# Patient Record
Sex: Male | Born: 1965 | Race: Black or African American | Hispanic: No | Marital: Married | Smoking: Former smoker
Health system: Southern US, Community
[De-identification: ages and names within clinical notes are randomized; demographics above are authoritative.]

## PROBLEM LIST (undated history)

## (undated) DIAGNOSIS — E7800 Pure hypercholesterolemia, unspecified: Secondary | ICD-10-CM

## (undated) DIAGNOSIS — I10 Essential (primary) hypertension: Secondary | ICD-10-CM

## (undated) HISTORY — PX: OTHER SURGICAL HISTORY: SHX169

---

## 2001-07-13 ENCOUNTER — Emergency Department (HOSPITAL_COMMUNITY): Admission: EM | Admit: 2001-07-13 | Discharge: 2001-07-13 | Payer: Self-pay | Admitting: *Deleted

## 2001-11-15 ENCOUNTER — Emergency Department (HOSPITAL_COMMUNITY): Admission: EM | Admit: 2001-11-15 | Discharge: 2001-11-15 | Payer: Self-pay | Admitting: *Deleted

## 2002-05-17 ENCOUNTER — Emergency Department (HOSPITAL_COMMUNITY): Admission: EM | Admit: 2002-05-17 | Discharge: 2002-05-17 | Payer: Self-pay

## 2002-07-05 ENCOUNTER — Emergency Department (HOSPITAL_COMMUNITY): Admission: EM | Admit: 2002-07-05 | Discharge: 2002-07-05 | Payer: Self-pay | Admitting: Emergency Medicine

## 2002-11-21 ENCOUNTER — Emergency Department (HOSPITAL_COMMUNITY): Admission: EM | Admit: 2002-11-21 | Discharge: 2002-11-21 | Payer: Self-pay | Admitting: Emergency Medicine

## 2003-01-16 ENCOUNTER — Emergency Department (HOSPITAL_COMMUNITY): Admission: EM | Admit: 2003-01-16 | Discharge: 2003-01-17 | Payer: Self-pay | Admitting: *Deleted

## 2003-04-03 ENCOUNTER — Emergency Department (HOSPITAL_COMMUNITY): Admission: EM | Admit: 2003-04-03 | Discharge: 2003-04-03 | Payer: Self-pay | Admitting: *Deleted

## 2003-05-29 ENCOUNTER — Emergency Department (HOSPITAL_COMMUNITY): Admission: EM | Admit: 2003-05-29 | Discharge: 2003-05-29 | Payer: Self-pay | Admitting: Emergency Medicine

## 2003-09-04 ENCOUNTER — Emergency Department (HOSPITAL_COMMUNITY): Admission: EM | Admit: 2003-09-04 | Discharge: 2003-09-04 | Payer: Self-pay | Admitting: Emergency Medicine

## 2004-01-01 ENCOUNTER — Emergency Department (HOSPITAL_COMMUNITY): Admission: EM | Admit: 2004-01-01 | Discharge: 2004-01-01 | Payer: Self-pay | Admitting: Emergency Medicine

## 2004-02-07 ENCOUNTER — Emergency Department (HOSPITAL_COMMUNITY): Admission: EM | Admit: 2004-02-07 | Discharge: 2004-02-07 | Payer: Self-pay | Admitting: Emergency Medicine

## 2004-03-30 ENCOUNTER — Emergency Department (HOSPITAL_COMMUNITY): Admission: EM | Admit: 2004-03-30 | Discharge: 2004-03-30 | Payer: Self-pay | Admitting: Emergency Medicine

## 2004-04-02 ENCOUNTER — Emergency Department (HOSPITAL_COMMUNITY): Admission: EM | Admit: 2004-04-02 | Discharge: 2004-04-03 | Payer: Self-pay | Admitting: Emergency Medicine

## 2004-05-22 ENCOUNTER — Emergency Department (HOSPITAL_COMMUNITY): Admission: EM | Admit: 2004-05-22 | Discharge: 2004-05-22 | Payer: Self-pay | Admitting: Emergency Medicine

## 2004-07-09 ENCOUNTER — Emergency Department (HOSPITAL_COMMUNITY): Admission: EM | Admit: 2004-07-09 | Discharge: 2004-07-09 | Payer: Self-pay | Admitting: Emergency Medicine

## 2004-10-31 ENCOUNTER — Emergency Department (HOSPITAL_COMMUNITY): Admission: EM | Admit: 2004-10-31 | Discharge: 2004-10-31 | Payer: Self-pay | Admitting: Emergency Medicine

## 2004-12-17 ENCOUNTER — Emergency Department (HOSPITAL_COMMUNITY): Admission: EM | Admit: 2004-12-17 | Discharge: 2004-12-17 | Payer: Self-pay | Admitting: Emergency Medicine

## 2005-04-14 ENCOUNTER — Emergency Department (HOSPITAL_COMMUNITY): Admission: EM | Admit: 2005-04-14 | Discharge: 2005-04-14 | Payer: Self-pay | Admitting: Emergency Medicine

## 2005-06-17 ENCOUNTER — Emergency Department (HOSPITAL_COMMUNITY): Admission: EM | Admit: 2005-06-17 | Discharge: 2005-06-17 | Payer: Self-pay | Admitting: Emergency Medicine

## 2005-07-29 ENCOUNTER — Emergency Department (HOSPITAL_COMMUNITY): Admission: EM | Admit: 2005-07-29 | Discharge: 2005-07-29 | Payer: Self-pay | Admitting: Emergency Medicine

## 2005-08-25 ENCOUNTER — Emergency Department (HOSPITAL_COMMUNITY): Admission: EM | Admit: 2005-08-25 | Discharge: 2005-08-25 | Payer: Self-pay | Admitting: Emergency Medicine

## 2005-10-14 ENCOUNTER — Emergency Department (HOSPITAL_COMMUNITY): Admission: EM | Admit: 2005-10-14 | Discharge: 2005-10-14 | Payer: Self-pay | Admitting: Emergency Medicine

## 2005-12-03 ENCOUNTER — Emergency Department (HOSPITAL_COMMUNITY): Admission: EM | Admit: 2005-12-03 | Discharge: 2005-12-03 | Payer: Self-pay | Admitting: Emergency Medicine

## 2006-01-05 ENCOUNTER — Emergency Department (HOSPITAL_COMMUNITY): Admission: EM | Admit: 2006-01-05 | Discharge: 2006-01-05 | Payer: Self-pay | Admitting: Emergency Medicine

## 2006-06-25 ENCOUNTER — Emergency Department (HOSPITAL_COMMUNITY): Admission: EM | Admit: 2006-06-25 | Discharge: 2006-06-25 | Payer: Self-pay | Admitting: Emergency Medicine

## 2006-07-29 ENCOUNTER — Emergency Department (HOSPITAL_COMMUNITY): Admission: EM | Admit: 2006-07-29 | Discharge: 2006-07-29 | Payer: Self-pay | Admitting: Emergency Medicine

## 2006-10-05 ENCOUNTER — Emergency Department (HOSPITAL_COMMUNITY): Admission: EM | Admit: 2006-10-05 | Discharge: 2006-10-05 | Payer: Self-pay | Admitting: Emergency Medicine

## 2007-03-02 ENCOUNTER — Emergency Department (HOSPITAL_COMMUNITY): Admission: EM | Admit: 2007-03-02 | Discharge: 2007-03-02 | Payer: Self-pay | Admitting: Emergency Medicine

## 2007-04-30 ENCOUNTER — Emergency Department (HOSPITAL_COMMUNITY): Admission: EM | Admit: 2007-04-30 | Discharge: 2007-04-30 | Payer: Self-pay | Admitting: Emergency Medicine

## 2007-05-28 ENCOUNTER — Emergency Department (HOSPITAL_COMMUNITY): Admission: EM | Admit: 2007-05-28 | Discharge: 2007-05-28 | Payer: Self-pay | Admitting: Emergency Medicine

## 2007-06-28 ENCOUNTER — Emergency Department (HOSPITAL_COMMUNITY): Admission: EM | Admit: 2007-06-28 | Discharge: 2007-06-28 | Payer: Self-pay | Admitting: Emergency Medicine

## 2007-07-21 ENCOUNTER — Emergency Department (HOSPITAL_COMMUNITY): Admission: EM | Admit: 2007-07-21 | Discharge: 2007-07-21 | Payer: Self-pay | Admitting: Emergency Medicine

## 2007-08-10 ENCOUNTER — Emergency Department (HOSPITAL_COMMUNITY): Admission: EM | Admit: 2007-08-10 | Discharge: 2007-08-10 | Payer: Self-pay | Admitting: Emergency Medicine

## 2007-09-03 ENCOUNTER — Emergency Department (HOSPITAL_COMMUNITY): Admission: EM | Admit: 2007-09-03 | Discharge: 2007-09-03 | Payer: Self-pay | Admitting: Emergency Medicine

## 2007-10-11 ENCOUNTER — Emergency Department (HOSPITAL_COMMUNITY): Admission: EM | Admit: 2007-10-11 | Discharge: 2007-10-11 | Payer: Self-pay | Admitting: Emergency Medicine

## 2007-12-20 ENCOUNTER — Emergency Department (HOSPITAL_COMMUNITY): Admission: EM | Admit: 2007-12-20 | Discharge: 2007-12-20 | Payer: Self-pay | Admitting: Emergency Medicine

## 2008-04-17 ENCOUNTER — Emergency Department (HOSPITAL_COMMUNITY): Admission: EM | Admit: 2008-04-17 | Discharge: 2008-04-17 | Payer: Self-pay | Admitting: Emergency Medicine

## 2008-05-22 ENCOUNTER — Emergency Department (HOSPITAL_COMMUNITY): Admission: EM | Admit: 2008-05-22 | Discharge: 2008-05-22 | Payer: Self-pay | Admitting: Emergency Medicine

## 2008-06-17 ENCOUNTER — Emergency Department (HOSPITAL_COMMUNITY): Admission: EM | Admit: 2008-06-17 | Discharge: 2008-06-17 | Payer: Self-pay | Admitting: Emergency Medicine

## 2008-06-22 ENCOUNTER — Emergency Department (HOSPITAL_COMMUNITY): Admission: EM | Admit: 2008-06-22 | Discharge: 2008-06-22 | Payer: Self-pay | Admitting: Emergency Medicine

## 2008-07-25 ENCOUNTER — Emergency Department (HOSPITAL_COMMUNITY): Admission: EM | Admit: 2008-07-25 | Discharge: 2008-07-25 | Payer: Self-pay | Admitting: Emergency Medicine

## 2008-09-04 ENCOUNTER — Emergency Department (HOSPITAL_COMMUNITY): Admission: EM | Admit: 2008-09-04 | Discharge: 2008-09-04 | Payer: Self-pay | Admitting: Emergency Medicine

## 2008-10-31 ENCOUNTER — Emergency Department (HOSPITAL_COMMUNITY): Admission: EM | Admit: 2008-10-31 | Discharge: 2008-10-31 | Payer: Self-pay | Admitting: Emergency Medicine

## 2008-12-06 ENCOUNTER — Emergency Department (HOSPITAL_COMMUNITY): Admission: EM | Admit: 2008-12-06 | Discharge: 2008-12-06 | Payer: Self-pay | Admitting: Emergency Medicine

## 2009-01-15 ENCOUNTER — Emergency Department (HOSPITAL_COMMUNITY): Admission: EM | Admit: 2009-01-15 | Discharge: 2009-01-15 | Payer: Self-pay | Admitting: Emergency Medicine

## 2009-02-05 ENCOUNTER — Emergency Department (HOSPITAL_COMMUNITY): Admission: EM | Admit: 2009-02-05 | Discharge: 2009-02-05 | Payer: Self-pay | Admitting: Emergency Medicine

## 2009-04-02 ENCOUNTER — Emergency Department (HOSPITAL_COMMUNITY): Admission: EM | Admit: 2009-04-02 | Discharge: 2009-04-02 | Payer: Self-pay | Admitting: Emergency Medicine

## 2009-06-19 ENCOUNTER — Emergency Department (HOSPITAL_COMMUNITY): Admission: EM | Admit: 2009-06-19 | Discharge: 2009-06-19 | Payer: Self-pay | Admitting: Emergency Medicine

## 2009-10-30 ENCOUNTER — Emergency Department (HOSPITAL_COMMUNITY): Admission: EM | Admit: 2009-10-30 | Discharge: 2009-10-30 | Payer: Self-pay | Admitting: Emergency Medicine

## 2010-01-13 ENCOUNTER — Emergency Department (HOSPITAL_COMMUNITY): Admission: EM | Admit: 2010-01-13 | Discharge: 2010-01-13 | Payer: Self-pay | Admitting: Emergency Medicine

## 2010-06-17 ENCOUNTER — Emergency Department (HOSPITAL_COMMUNITY): Admission: EM | Admit: 2010-06-17 | Discharge: 2010-06-17 | Payer: Self-pay | Admitting: Emergency Medicine

## 2010-06-25 ENCOUNTER — Emergency Department (HOSPITAL_COMMUNITY): Admission: EM | Admit: 2010-06-25 | Discharge: 2010-06-25 | Payer: Self-pay | Admitting: Emergency Medicine

## 2010-07-23 ENCOUNTER — Emergency Department (HOSPITAL_COMMUNITY): Admission: EM | Admit: 2010-07-23 | Discharge: 2010-07-23 | Payer: Self-pay | Admitting: Emergency Medicine

## 2010-09-04 ENCOUNTER — Emergency Department (HOSPITAL_COMMUNITY): Admission: EM | Admit: 2010-09-04 | Discharge: 2010-09-04 | Payer: Self-pay | Admitting: Emergency Medicine

## 2010-09-19 ENCOUNTER — Emergency Department (HOSPITAL_COMMUNITY): Admission: EM | Admit: 2010-09-19 | Discharge: 2010-09-19 | Payer: Self-pay | Admitting: Emergency Medicine

## 2010-11-13 ENCOUNTER — Emergency Department (HOSPITAL_COMMUNITY)
Admission: EM | Admit: 2010-11-13 | Discharge: 2010-11-13 | Payer: Self-pay | Source: Home / Self Care | Admitting: Emergency Medicine

## 2011-02-10 LAB — URINALYSIS, ROUTINE W REFLEX MICROSCOPIC
Bilirubin Urine: NEGATIVE
Glucose, UA: NEGATIVE mg/dL
Hgb urine dipstick: NEGATIVE
Ketones, ur: NEGATIVE mg/dL
Nitrite: NEGATIVE
Protein, ur: NEGATIVE mg/dL
Specific Gravity, Urine: 1.025 (ref 1.005–1.030)
Urobilinogen, UA: 0.2 mg/dL (ref 0.0–1.0)
pH: 5.5 (ref 5.0–8.0)

## 2011-02-13 LAB — DIFFERENTIAL
Basophils Absolute: 0 10*3/uL (ref 0.0–0.1)
Basophils Relative: 0 % (ref 0–1)
Eosinophils Absolute: 0.2 10*3/uL (ref 0.0–0.7)
Eosinophils Relative: 3 % (ref 0–5)
Lymphocytes Relative: 27 % (ref 12–46)
Lymphs Abs: 1.9 10*3/uL (ref 0.7–4.0)
Monocytes Absolute: 0.5 10*3/uL (ref 0.1–1.0)
Monocytes Relative: 7 % (ref 3–12)
Neutro Abs: 4.4 10*3/uL (ref 1.7–7.7)
Neutrophils Relative %: 63 % (ref 43–77)

## 2011-02-13 LAB — CBC
HCT: 44.6 % (ref 39.0–52.0)
Hemoglobin: 15.2 g/dL (ref 13.0–17.0)
MCH: 28.7 pg (ref 26.0–34.0)
MCHC: 34 g/dL (ref 30.0–36.0)
MCV: 84.3 fL (ref 78.0–100.0)
Platelets: 202 10*3/uL (ref 150–400)
RBC: 5.29 MIL/uL (ref 4.22–5.81)
RDW: 14.2 % (ref 11.5–15.5)
WBC: 7 10*3/uL (ref 4.0–10.5)

## 2011-02-13 LAB — URINALYSIS, ROUTINE W REFLEX MICROSCOPIC
Bilirubin Urine: NEGATIVE
Glucose, UA: NEGATIVE mg/dL
Hgb urine dipstick: NEGATIVE
Ketones, ur: NEGATIVE mg/dL
Nitrite: NEGATIVE
Protein, ur: NEGATIVE mg/dL
Specific Gravity, Urine: 1.01 (ref 1.005–1.030)
Urobilinogen, UA: 0.2 mg/dL (ref 0.0–1.0)
pH: 6.5 (ref 5.0–8.0)

## 2011-02-13 LAB — COMPREHENSIVE METABOLIC PANEL
ALT: 27 U/L (ref 0–53)
AST: 18 U/L (ref 0–37)
Albumin: 4 g/dL (ref 3.5–5.2)
Alkaline Phosphatase: 64 U/L (ref 39–117)
BUN: 13 mg/dL (ref 6–23)
CO2: 26 mEq/L (ref 19–32)
Calcium: 9.1 mg/dL (ref 8.4–10.5)
Chloride: 106 mEq/L (ref 96–112)
Creatinine, Ser: 1 mg/dL (ref 0.4–1.5)
GFR calc Af Amer: >60 mL/min (ref >60)
GFR calc non Af Amer: >60 mL/min (ref >60)
Glucose, Bld: 87 mg/dL (ref 70–99)
Potassium: 4.4 mEq/L (ref 3.5–5.1)
Sodium: 137 mEq/L (ref 135–145)
Total Bilirubin: 1 mg/dL (ref 0.3–1.2)
Total Protein: 6.9 g/dL (ref 6.0–8.3)

## 2011-03-09 LAB — COMPREHENSIVE METABOLIC PANEL
ALT: 25 U/L (ref 0–53)
AST: 26 U/L (ref 0–37)
Albumin: 4 g/dL (ref 3.5–5.2)
Alkaline Phosphatase: 78 U/L (ref 39–117)
BUN: 12 mg/dL (ref 6–23)
CO2: 27 mEq/L (ref 19–32)
Calcium: 9.4 mg/dL (ref 8.4–10.5)
Chloride: 105 mEq/L (ref 96–112)
Creatinine, Ser: 1.12 mg/dL (ref 0.4–1.5)
GFR calc Af Amer: >60 mL/min (ref >60)
GFR calc non Af Amer: >60 mL/min (ref >60)
Glucose, Bld: 86 mg/dL (ref 70–99)
Potassium: 3.8 mEq/L (ref 3.5–5.1)
Sodium: 138 mEq/L (ref 135–145)
Total Bilirubin: 1.1 mg/dL (ref 0.3–1.2)
Total Protein: 6.9 g/dL (ref 6.0–8.3)

## 2011-03-09 LAB — DIFFERENTIAL
Basophils Absolute: 0.1 10*3/uL (ref 0.0–0.1)
Basophils Relative: 1 % (ref 0–1)
Eosinophils Absolute: 0.2 10*3/uL (ref 0.0–0.7)
Eosinophils Relative: 2 % (ref 0–5)
Lymphocytes Relative: 26 % (ref 12–46)
Lymphs Abs: 1.9 10*3/uL (ref 0.7–4.0)
Monocytes Absolute: 0.6 10*3/uL (ref 0.1–1.0)
Monocytes Relative: 8 % (ref 3–12)
Neutro Abs: 4.7 10*3/uL (ref 1.7–7.7)
Neutrophils Relative %: 63 % (ref 43–77)

## 2011-03-09 LAB — CBC
HCT: 44.9 % (ref 39.0–52.0)
Hemoglobin: 15.9 g/dL (ref 13.0–17.0)
MCHC: 35.3 g/dL (ref 30.0–36.0)
MCV: 83.7 fL (ref 78.0–100.0)
Platelets: 202 10*3/uL (ref 150–400)
RBC: 5.36 MIL/uL (ref 4.22–5.81)
RDW: 13.8 % (ref 11.5–15.5)
WBC: 7.4 10*3/uL (ref 4.0–10.5)

## 2011-03-09 LAB — URINALYSIS, ROUTINE W REFLEX MICROSCOPIC
Bilirubin Urine: NEGATIVE
Glucose, UA: NEGATIVE mg/dL
Hgb urine dipstick: NEGATIVE
Ketones, ur: NEGATIVE mg/dL
Nitrite: NEGATIVE
Protein, ur: NEGATIVE mg/dL
Specific Gravity, Urine: 1.015 (ref 1.005–1.030)
Urobilinogen, UA: 0.2 mg/dL (ref 0.0–1.0)
pH: 5.5 (ref 5.0–8.0)

## 2011-03-09 LAB — LIPASE, BLOOD: Lipase: 19 U/L (ref 11–59)

## 2011-03-18 LAB — POCT I-STAT, CHEM 8
BUN: 14 mg/dL (ref 6–23)
Calcium, Ion: 1.12 mmol/L (ref 1.12–1.32)
Chloride: 108 mEq/L (ref 96–112)
Creatinine, Ser: 1 mg/dL (ref 0.4–1.5)
Glucose, Bld: 105 mg/dL — ABNORMAL HIGH (ref 70–99)
HCT: 48 % (ref 39.0–52.0)
Hemoglobin: 16.3 g/dL (ref 13.0–17.0)
Potassium: 4 mEq/L (ref 3.5–5.1)
Sodium: 140 mEq/L (ref 135–145)
TCO2: 21 mmol/L (ref 0–100)

## 2011-03-18 LAB — POCT CARDIAC MARKERS
CKMB, poc: 1.1 ng/mL (ref 1.0–8.0)
Myoglobin, poc: 57 ng/mL (ref 12–200)
Troponin i, poc: <0.05 ng/mL (ref 0.00–0.09)

## 2011-04-21 ENCOUNTER — Emergency Department (HOSPITAL_COMMUNITY)
Admission: EM | Admit: 2011-04-21 | Discharge: 2011-04-21 | Disposition: A | Discharge status: Home / Self Care | Payer: 59 | Attending: Emergency Medicine | Admitting: Emergency Medicine

## 2011-04-21 ENCOUNTER — Emergency Department (HOSPITAL_COMMUNITY): Payer: 59

## 2011-04-21 DIAGNOSIS — M79609 Pain in unspecified limb: Secondary | ICD-10-CM | POA: Insufficient documentation

## 2011-04-21 DIAGNOSIS — E785 Hyperlipidemia, unspecified: Secondary | ICD-10-CM | POA: Insufficient documentation

## 2011-04-21 DIAGNOSIS — I10 Essential (primary) hypertension: Secondary | ICD-10-CM | POA: Insufficient documentation

## 2011-04-21 DIAGNOSIS — M109 Gout, unspecified: Secondary | ICD-10-CM | POA: Insufficient documentation

## 2011-05-19 ENCOUNTER — Emergency Department (HOSPITAL_COMMUNITY)
Admission: EM | Admit: 2011-05-19 | Discharge: 2011-05-19 | Disposition: A | Discharge status: Home / Self Care | Payer: 59 | Attending: Emergency Medicine | Admitting: Emergency Medicine

## 2011-05-19 DIAGNOSIS — Y92009 Unspecified place in unspecified non-institutional (private) residence as the place of occurrence of the external cause: Secondary | ICD-10-CM | POA: Insufficient documentation

## 2011-05-19 DIAGNOSIS — I10 Essential (primary) hypertension: Secondary | ICD-10-CM | POA: Insufficient documentation

## 2011-05-19 DIAGNOSIS — M549 Dorsalgia, unspecified: Secondary | ICD-10-CM | POA: Insufficient documentation

## 2011-05-19 DIAGNOSIS — X58XXXA Exposure to other specified factors, initial encounter: Secondary | ICD-10-CM | POA: Insufficient documentation

## 2011-05-19 DIAGNOSIS — S61409A Unspecified open wound of unspecified hand, initial encounter: Secondary | ICD-10-CM | POA: Insufficient documentation

## 2011-07-11 ENCOUNTER — Encounter: Payer: Self-pay | Admitting: *Deleted

## 2011-07-11 ENCOUNTER — Emergency Department (HOSPITAL_COMMUNITY)
Admission: EM | Admit: 2011-07-11 | Discharge: 2011-07-11 | Disposition: A | Discharge status: Home / Self Care | Payer: 59 | Attending: Emergency Medicine | Admitting: Emergency Medicine

## 2011-07-11 ENCOUNTER — Other Ambulatory Visit: Payer: Self-pay

## 2011-07-11 ENCOUNTER — Emergency Department (HOSPITAL_COMMUNITY): Payer: 59

## 2011-07-11 DIAGNOSIS — J40 Bronchitis, not specified as acute or chronic: Secondary | ICD-10-CM | POA: Insufficient documentation

## 2011-07-11 DIAGNOSIS — F17200 Nicotine dependence, unspecified, uncomplicated: Secondary | ICD-10-CM | POA: Insufficient documentation

## 2011-07-11 DIAGNOSIS — I10 Essential (primary) hypertension: Secondary | ICD-10-CM | POA: Insufficient documentation

## 2011-07-11 HISTORY — DX: Essential (primary) hypertension: I10

## 2011-07-11 MED ORDER — ALBUTEROL SULFATE HFA 108 (90 BASE) MCG/ACT IN AERS: 1.0000 | INHALATION_SPRAY | Freq: Four times a day (QID) | RESPIRATORY_TRACT | Status: DC | PRN

## 2011-07-11 MED ORDER — BENZONATATE 100 MG PO CAPS: 200.0000 mg | ORAL_CAPSULE | Freq: Two times a day (BID) | ORAL | Status: AC | PRN

## 2011-07-11 MED ORDER — ALBUTEROL SULFATE HFA 108 (90 BASE) MCG/ACT IN AERS
2.0000 | INHALATION_SPRAY | Freq: Once | RESPIRATORY_TRACT | Status: AC
Start: 2011-07-11 — End: 2011-07-11
  Administered 2011-07-11: 2 via RESPIRATORY_TRACT
  Filled 2011-07-11: qty 6.7

## 2011-07-11 NOTE — ED Notes (Signed)
C/o congestion, productive cough, and difficulty breathing that started last night.

## 2011-07-11 NOTE — ED Notes (Signed)
C/o cough and feeling sob. C/o cp with deep breaths. nad noted at this time. Pt breathing even/nonlabored and all lung fields cta. Sats maintained on RA.

## 2011-07-11 NOTE — ED Provider Notes (Signed)
History     CSN: [id number] Arrival date & time: 07/11/2011  4:30 PM  Chief Complaint  Patient presents with  . Cough  . Nasal Congestion   HPI Comments: Patient is a 45 year old male who presents with a complaint of cough and shortness of breath. He states that this started last night and has been persistent, nothing makes it better or worse, associated with no fevers or chills. The cough is dry and nonproductive. He denies swelling in his legs, positional discomfort or dyspnea, exertional symptoms, skin rashes. He notes a dose have some squeezing in his chest when he takes a deep breath. He does have a history of hypertension but no history of diabetes.  Patient is a 45 y.o. male presenting with cough. The history is provided by the patient.  Cough This is a new problem. The current episode started 12 to 24 hours ago. The problem occurs every few minutes. The problem has been rapidly worsening. The cough is non-productive. There has been no fever. Associated symptoms include sore throat and shortness of breath. Pertinent negatives include no chest pain, no chills, no sweats, no weight loss, no ear congestion, no ear pain, no headaches, no rhinorrhea, no myalgias, no wheezing and no eye redness. He has tried nothing for the symptoms. He is a smoker (Occasional cigar). His past medical history does not include bronchitis, pneumonia, COPD, emphysema or asthma.    Past Medical History  Diagnosis Date  . Hypertension   . Gout     History reviewed. No pertinent past surgical history.  No family history on file.  History  Substance Use Topics  . Smoking status: Current Some Day Smoker    Types: Cigars  . Smokeless tobacco: Not on file  . Alcohol Use: No      Review of Systems  Constitutional: Negative for fever, chills and weight loss.  HENT: Positive for sore throat. Negative for ear pain, rhinorrhea and neck pain.   Eyes: Negative for redness and visual disturbance.  Respiratory:  Positive for cough and shortness of breath. Negative for wheezing.   Cardiovascular: Negative for chest pain.  Gastrointestinal: Negative for nausea, vomiting, abdominal pain and diarrhea.  Genitourinary: Negative for dysuria and frequency.  Musculoskeletal: Negative for myalgias and back pain.  Skin: Negative for rash.  Neurological: Negative for weakness, numbness and headaches.  Hematological: Negative for adenopathy.  Psychiatric/Behavioral: Negative for behavioral problems.    Physical Exam  BP 155/104  Pulse 75  Temp(Src) 98.4 F (36.9 C) (Oral)  Resp 20  Ht 6\' 2"  (1.88 m)  Wt 280 lb (127.007 kg)  BMI 35.95 kg/m2  SpO2 100%  Physical Exam  Nursing note and vitals reviewed. Constitutional: He appears well-developed and well-nourished. No distress.  HENT:  Head: Normocephalic and atraumatic.  Mouth/Throat: Oropharynx is clear and moist. No oropharyngeal exudate.  Eyes: Conjunctivae and EOM are normal. Pupils are equal, round, and reactive to light. Right eye exhibits no discharge. Left eye exhibits no discharge. No scleral icterus.  Neck: Normal range of motion. Neck supple. No JVD present. No thyromegaly present.  Cardiovascular: Normal rate, regular rhythm, normal heart sounds and intact distal pulses.  Exam reveals no gallop and no friction rub.   No murmur heard. Pulmonary/Chest: Effort normal and breath sounds normal. No respiratory distress. He has no wheezes. He has no rales.  Abdominal: Soft. Bowel sounds are normal. He exhibits no distension and no mass. There is no tenderness.  Musculoskeletal: Normal range of motion. He exhibits  no edema and no tenderness.  Lymphadenopathy:    He has no cervical adenopathy.  Neurological: He is alert. Coordination normal.  Skin: Skin is warm and dry. No rash noted. No erythema.  Psychiatric: He has a normal mood and affect. His behavior is normal.    ED Course  Procedures  MDM Patient has very normal exam including clear  lung fields, no tenderness to palpation over his chest or abdomen and no peripheral edema. He has a clear oropharynx without any pharyngeal edema, exudate, asymmetry. Will get a chest x-ray and EKG to rule out any suspicious sources of cardiac or pulmonary source. Suspect a bronchitis albuterol given.    ED ECG REPORT   Date: 07/11/2011   Rate: 71  Rhythm: normal sinus rhythm  QRS Axis: normal  Intervals: normal  ST/T Wave abnormalities: normal  Conduction Disutrbances:none  Narrative Interpretation:   Old EKG Reviewed: none available  Chest x-ray reviewed and reveals no signs of infiltrate. Albuterol given and Tessalon Perles without albuterol for discharge.  Vida Roller, MD 07/11/11 [phone]

## 2011-07-28 ENCOUNTER — Emergency Department (HOSPITAL_COMMUNITY)
Admission: EM | Admit: 2011-07-28 | Discharge: 2011-07-28 | Disposition: A | Discharge status: Home / Self Care | Payer: 59 | Attending: Emergency Medicine | Admitting: Emergency Medicine

## 2011-07-28 ENCOUNTER — Encounter (HOSPITAL_COMMUNITY): Payer: Self-pay | Admitting: Emergency Medicine

## 2011-07-28 DIAGNOSIS — I10 Essential (primary) hypertension: Secondary | ICD-10-CM | POA: Insufficient documentation

## 2011-07-28 DIAGNOSIS — H1045 Other chronic allergic conjunctivitis: Secondary | ICD-10-CM

## 2011-07-28 DIAGNOSIS — F17200 Nicotine dependence, unspecified, uncomplicated: Secondary | ICD-10-CM | POA: Insufficient documentation

## 2011-07-28 MED ORDER — KETOROLAC TROMETHAMINE 0.5 % OP SOLN
1.0000 [drp] | Freq: Once | OPHTHALMIC | Status: AC
  Administered 2011-07-28: 1 [drp] via OPHTHALMIC
  Filled 2011-07-28: qty 5

## 2011-07-28 NOTE — ED Notes (Signed)
Pt c/o sinus pressure and head pain since yesterday.

## 2011-07-28 NOTE — ED Provider Notes (Signed)
History     CSN: [id number] Arrival date & time: 07/28/2011  5:11 PM  Chief Complaint  Patient presents with  . Sinusitis   Patient is a 45 y.o. male presenting with sinusitis. The history is provided by the patient.  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  He has had nasal congestion,  irritated and itchy eyes since the event.) problem. The current episode started 12 to 24 hours ago. The problem has not changed since onset.There has been no fever. The pain is at a severity of 1/10. The pain is mild. The pain has been constant since onset. Associated symptoms include congestion. Pertinent negatives include no chills, no ear pain, no hoarse voice, no sinus pressure, no sore throat, no cough and no shortness of breath. He has tried nothing for the symptoms.    Past Medical History  Diagnosis Date  . Hypertension   . Gout   . Gout     History reviewed. No pertinent past surgical history.  History reviewed. No pertinent family history.  History  Substance Use Topics  . Smoking status: Current Some Day Smoker    Types: Cigars  . Smokeless tobacco: Not on file  . Alcohol Use: No      Review of Systems  Constitutional: Negative for fever and chills.  HENT: Positive for congestion and rhinorrhea. Negative for ear pain, sore throat, hoarse voice, trouble swallowing, neck pain, voice change, postnasal drip and sinus pressure.   Eyes: Negative.   Respiratory: Negative for cough, choking, chest tightness, shortness of breath, wheezing and stridor.   Cardiovascular: Negative for chest pain and palpitations.  Gastrointestinal: Negative for nausea and abdominal pain.  Genitourinary: Negative.   Musculoskeletal: Negative for joint swelling and arthralgias.  Skin: Negative.  Negative for rash and wound.  Neurological: Negative for dizziness, weakness, light-headedness,  numbness and headaches.  Hematological: Negative.   Psychiatric/Behavioral: Negative.     Physical Exam  BP 145/91  Pulse 75  Temp(Src) 98.6 F (37 C) (Oral)  Resp 16  Ht 6\' 2"  (1.88 m)  Wt 285 lb (129.275 kg)  BMI 36.59 kg/m2  SpO2 98%  Physical Exam  Nursing note and vitals reviewed. Constitutional: He is oriented to person, place, and time. He appears well-developed and well-nourished.  HENT:  Head: Normocephalic and atraumatic.  Eyes: EOM and lids are normal. Pupils are equal, round, and reactive to light. Right conjunctiva is injected. Left conjunctiva is injected.  Neck: Normal range of motion.  Cardiovascular: Normal rate, regular rhythm, normal heart sounds and intact distal pulses.   Pulmonary/Chest: Effort normal and breath sounds normal. He has no wheezes.  Abdominal: Soft. Bowel sounds are normal. There is no tenderness.  Musculoskeletal: Normal range of motion.  Neurological: He is alert and oriented to person, place, and time.  Skin: Skin is warm and dry.  Psychiatric: He has a normal mood and affect.    ED Course  Procedures  MDM Allergic conjunctivitis.      Candis Musa, PA 07/28/11 1956

## 2011-07-29 NOTE — ED Provider Notes (Signed)
Medical screening examination/treatment/procedure(s) were performed by non-physician practitioner and as supervising physician I was immediately available for consultation/collaboration. Devoria Albe, MD, Armando Gang  Ward Givens, MD 07/29/11 0111

## 2011-08-18 ENCOUNTER — Other Ambulatory Visit: Payer: Self-pay

## 2011-08-18 ENCOUNTER — Emergency Department (HOSPITAL_COMMUNITY): Payer: 59

## 2011-08-18 ENCOUNTER — Encounter (HOSPITAL_COMMUNITY): Payer: Self-pay | Admitting: *Deleted

## 2011-08-18 ENCOUNTER — Emergency Department (HOSPITAL_COMMUNITY)
Admission: EM | Admit: 2011-08-18 | Discharge: 2011-08-18 | Disposition: A | Discharge status: Home / Self Care | Payer: 59 | Attending: Emergency Medicine | Admitting: Emergency Medicine

## 2011-08-18 DIAGNOSIS — R1012 Left upper quadrant pain: Secondary | ICD-10-CM | POA: Insufficient documentation

## 2011-08-18 DIAGNOSIS — R112 Nausea with vomiting, unspecified: Secondary | ICD-10-CM | POA: Insufficient documentation

## 2011-08-18 DIAGNOSIS — K5289 Other specified noninfective gastroenteritis and colitis: Secondary | ICD-10-CM

## 2011-08-18 DIAGNOSIS — R197 Diarrhea, unspecified: Secondary | ICD-10-CM | POA: Insufficient documentation

## 2011-08-18 DIAGNOSIS — I10 Essential (primary) hypertension: Secondary | ICD-10-CM | POA: Insufficient documentation

## 2011-08-18 DIAGNOSIS — R1011 Right upper quadrant pain: Secondary | ICD-10-CM | POA: Insufficient documentation

## 2011-08-18 DIAGNOSIS — F17200 Nicotine dependence, unspecified, uncomplicated: Secondary | ICD-10-CM | POA: Insufficient documentation

## 2011-08-18 DIAGNOSIS — R509 Fever, unspecified: Secondary | ICD-10-CM | POA: Insufficient documentation

## 2011-08-18 LAB — COMPREHENSIVE METABOLIC PANEL
AST: 21 U/L (ref 0–37)
BUN: 13 mg/dL (ref 6–23)
CO2: 23 mEq/L (ref 19–32)
Calcium: 9.7 mg/dL (ref 8.4–10.5)
Creatinine, Ser: 0.96 mg/dL (ref 0.50–1.35)
GFR calc Af Amer: >60 mL/min (ref >60)
GFR calc non Af Amer: >60 mL/min (ref >60)
Glucose, Bld: 98 mg/dL (ref 70–99)

## 2011-08-18 LAB — CBC
HCT: 47.5 % (ref 39.0–52.0)
MCH: 28.5 pg (ref 26.0–34.0)
MCV: 82 fL (ref 78.0–100.0)
Platelets: 232 10*3/uL (ref 150–400)
RBC: 5.79 MIL/uL (ref 4.22–5.81)

## 2011-08-18 LAB — LIPASE, BLOOD: Lipase: 30 U/L (ref 11–59)

## 2011-08-18 MED ORDER — IOHEXOL 300 MG/ML  SOLN
100.0000 mL | Freq: Once | INTRAMUSCULAR | Status: AC | PRN
  Administered 2011-08-18: 100 mL via INTRAVENOUS

## 2011-08-18 MED ORDER — ONDANSETRON HCL 4 MG/2ML IJ SOLN
4.0000 mg | Freq: Once | INTRAMUSCULAR | Status: AC
  Administered 2011-08-18: 4 mg via INTRAVENOUS
  Filled 2011-08-18: qty 2

## 2011-08-18 MED ORDER — HYDROMORPHONE HCL 1 MG/ML IJ SOLN
1.0000 mg | Freq: Once | INTRAMUSCULAR | Status: AC
  Administered 2011-08-18: 1 mg via INTRAVENOUS
  Filled 2011-08-18: qty 1

## 2011-08-18 MED ORDER — SODIUM CHLORIDE 0.9 % IV BOLUS (SEPSIS)
250.0000 mL | Freq: Once | INTRAVENOUS | Status: AC
  Administered 2011-08-18: 1000 mL via INTRAVENOUS

## 2011-08-18 MED ORDER — HYDROCODONE-ACETAMINOPHEN 5-325 MG PO TABS: 1.0000 | ORAL_TABLET | Freq: Four times a day (QID) | ORAL | Status: AC | PRN

## 2011-08-18 MED ORDER — SODIUM CHLORIDE 0.9 % IV SOLN: INTRAVENOUS | Status: DC

## 2011-08-18 MED ORDER — ONDANSETRON HCL 4 MG PO TABS: 4.0000 mg | ORAL_TABLET | Freq: Three times a day (TID) | ORAL | Status: AC | PRN

## 2011-08-18 NOTE — ED Notes (Signed)
Abdominal pain. Nausea, vomiting and diarrhea

## 2011-08-18 NOTE — ED Provider Notes (Addendum)
History     CSN: [id number] Arrival date & time: 08/18/2011  7:51 PM   Chief Complaint  Patient presents with  . Abdominal Pain     (Include location/radiation/quality/duration/timing/severity/associated sxs/prior treatment) Patient is a 45 y.o. male presenting with abdominal pain.  Abdominal Pain The primary symptoms of the illness include abdominal pain, fever, nausea, vomiting and diarrhea. The primary symptoms of the illness do not include shortness of breath, hematemesis or hematochezia. The current episode started yesterday. The onset of the illness was sudden. The problem has not changed since onset. The abdominal pain is located in the RUQ and LUQ. The abdominal pain does not radiate. The severity of the abdominal pain is 8/10. The abdominal pain is relieved by nothing.  Symptoms associated with the illness do not include heartburn, hematuria or back pain. Significant associated medical issues do not include GERD or diabetes.   ASSOCIATED WITH VOMITNG X 2 EACH DAY  AND DIARRHEA X 4 EACH DAY.   Past Medical History  Diagnosis Date  . Hypertension   . Gout   . Gout      History reviewed. No pertinent past surgical history.  No family history on file.  History  Substance Use Topics  . Smoking status: Current Some Day Smoker    Types: Cigars  . Smokeless tobacco: Not on file  . Alcohol Use: No      Review of Systems  Constitutional: Positive for fever.  HENT: Negative for neck pain.   Eyes: Negative for redness.  Respiratory: Negative for shortness of breath.   Cardiovascular: Negative for chest pain.  Gastrointestinal: Positive for nausea, vomiting, abdominal pain and diarrhea. Negative for heartburn, blood in stool, hematochezia and hematemesis.  Genitourinary: Negative for hematuria.  Musculoskeletal: Negative for back pain.  Skin: Negative for rash.  Neurological: Negative for weakness and headaches.    Allergies  Review of patient's allergies indicates  no known allergies.  Home Medications   Current Outpatient Rx  Name Route Sig Dispense Refill  . ALBUTEROL SULFATE HFA 108 (90 BASE) MCG/ACT IN AERS Inhalation Inhale 1-2 puffs into the lungs every 6 (six) hours as needed for wheezing. 1 Inhaler 0  . LISINOPRIL 10 MG PO TABS Oral Take 10 mg by mouth at bedtime.      . ONE-A-DAY MENS PO Oral Take 1 tablet by mouth daily.      Marland Kitchen SALINE NASAL SPRAY NA Nasal Place 1 spray into the nose 2 (two) times daily.     . TETRAHYDROZOLINE HCL 0.05 % OP SOLN Both Eyes Place 2 drops into both eyes daily as needed. For allergy eye symptoms       Physical Exam    BP 154/99  Pulse 83  Temp 98.8 F (37.1 C)  Resp 20  Ht 6\' 2"  (1.88 m)  Wt 285 lb (129.275 kg)  BMI 36.59 kg/m2  SpO2 97%  Physical Exam  Nursing note and vitals reviewed. Constitutional: He is oriented to person, place, and time. He appears well-developed and well-nourished. No distress.  HENT:  Head: Normocephalic and atraumatic.  Mouth/Throat: Oropharynx is clear and moist.  Eyes: Conjunctivae and EOM are normal. Pupils are equal, round, and reactive to light.  Neck: Normal range of motion. Neck supple.  Cardiovascular: Normal rate and regular rhythm.   No murmur heard. Pulmonary/Chest: Effort normal and breath sounds normal. He exhibits no tenderness.  Abdominal: Soft. Bowel sounds are normal. There is no tenderness.  Musculoskeletal: Normal range of motion. He exhibits no  edema and no tenderness.  Neurological: He is alert and oriented to person, place, and time. He displays normal reflexes. No cranial nerve deficit. He exhibits normal muscle tone. Coordination normal.  Skin: Skin is warm and dry. No rash noted.    ED Course  Procedures  Results for orders placed during the hospital encounter of 08/18/11  CBC      Component Value Range   WBC 8.4  4.0 - 10.5 (K/uL)   RBC 5.79  4.22 - 5.81 (MIL/uL)   Hemoglobin 16.5  13.0 - 17.0 (g/dL)   HCT 40.9  81.1 - 91.4 (%)   MCV  82.0  78.0 - 100.0 (fL)   MCH 28.5  26.0 - 34.0 (pg)   MCHC 34.7  30.0 - 36.0 (g/dL)   RDW 78.2  95.6 - 21.3 (%)   Platelets 232  150 - 400 (K/uL)    Date: 08/18/2011  Rate: 72  Rhythm: normal sinus rhythm  QRS Axis: normal  Intervals: normal  ST/T Wave abnormalities: nonspecific T wave changes  Conduction Disutrbances:none  Narrative Interpretation:   Old EKG Reviewed: unchanged FROM 07/11/11 AND TODAY ? IN COMPLETE RBBB    No diagnosis found.   MDM UQ ABD PAIN NO CP WITH VOMITNG AND DIARRHEA BUT NOT CLASSIC FOR VIRAL GE. WILL GET ABD LABS AND CT ABD IF NEGATIVE OKAY FOR DISCHARGE.        Shelda Jakes, MD 08/18/11 0865  Shelda Jakes, MD 08/18/11 2227

## 2011-08-18 NOTE — ED Provider Notes (Signed)
Benny Lennert, MD to patient bedside. Patient explained lab and imaging results. Informed of intent to d/c home and recommend follow up with PCP should problems persist. Patient agrees with plan set forth at this time.   Written by Clarita Crane acting as scribe for Dr. Estell Harpin. The chart was scribed for me under my direct supervision.  I personally performed the history, physical, and medical decision making and all procedures in the evaluation of this patient.Benny Lennert, MD 08/18/11 2312

## 2011-12-24 ENCOUNTER — Encounter (HOSPITAL_COMMUNITY): Payer: Self-pay | Admitting: *Deleted

## 2011-12-24 ENCOUNTER — Emergency Department (HOSPITAL_COMMUNITY)
Admission: EM | Admit: 2011-12-24 | Discharge: 2011-12-24 | Disposition: A | Discharge status: Home / Self Care | Payer: 59 | Attending: Emergency Medicine | Admitting: Emergency Medicine

## 2011-12-24 DIAGNOSIS — M549 Dorsalgia, unspecified: Secondary | ICD-10-CM | POA: Insufficient documentation

## 2011-12-24 DIAGNOSIS — X503XXA Overexertion from repetitive movements, initial encounter: Secondary | ICD-10-CM | POA: Insufficient documentation

## 2011-12-24 DIAGNOSIS — T148XXA Other injury of unspecified body region, initial encounter: Secondary | ICD-10-CM | POA: Insufficient documentation

## 2011-12-24 DIAGNOSIS — I10 Essential (primary) hypertension: Secondary | ICD-10-CM | POA: Insufficient documentation

## 2011-12-24 MED ORDER — IBUPROFEN 800 MG PO TABS: 800.0000 mg | ORAL_TABLET | Freq: Three times a day (TID) | ORAL | Status: AC

## 2011-12-24 MED ORDER — CYCLOBENZAPRINE HCL 10 MG PO TABS: 10.0000 mg | ORAL_TABLET | Freq: Two times a day (BID) | ORAL | Status: AC | PRN

## 2011-12-24 NOTE — ED Provider Notes (Signed)
Medical screening examination/treatment/procedure(s) were performed by non-physician practitioner and as supervising physician I was immediately available for consultation/collaboration.  Nicoletta Dress. Colon Branch, MD 12/24/11 2244

## 2011-12-24 NOTE — ED Notes (Signed)
LOw back pain,  Had been moving furniture, Has chronic back pain

## 2011-12-24 NOTE — ED Provider Notes (Signed)
History     CSN: [id number]  Arrival date & time 12/24/11  1406   First MD Initiated Contact with Patient 12/24/11 1421      Chief Complaint  Patient presents with  . Back Pain    HPI Cody Harmon is a 46 y.o. male who presents to the ED for low back pain. He reports lifting and moving furniture 2 days ago. Felt pain yesterday morning and has gotten progressively worse. Rates the pain as 10/10. Denies numbness or tingling of legs. the pain is located in the lower right side of the the back. No radiation. Denies loss of control of bladder or bowels. No nausea or vomiting. No other problems. The pain is better when remaining still and getting in certain positions. Worse with any movement. The history was provided by the patient.  Past Medical History  Diagnosis Date  . Hypertension   . Gout   . Gout     History reviewed. No pertinent past surgical history.  Family History  Problem Relation Age of Onset  . Hypertension Mother   . Stroke Father   . Stroke Brother   . Osteoarthritis Brother     History  Substance Use Topics  . Smoking status: Former Smoker    Types: Cigars  . Smokeless tobacco: Not on file  . Alcohol Use: No      Review of Systems  Constitutional: Negative for fever, chills, diaphoresis and fatigue.  HENT: Negative for ear pain, congestion, sore throat, facial swelling, neck pain, neck stiffness, dental problem and sinus pressure.   Eyes: Negative for photophobia, pain and discharge.  Respiratory: Negative for cough, chest tightness and wheezing.   Gastrointestinal: Negative for nausea, vomiting, abdominal pain, diarrhea, constipation and abdominal distention.  Genitourinary: Negative for dysuria, urgency, frequency, flank pain and difficulty urinating.  Musculoskeletal: Positive for back pain. Negative for myalgias and gait problem.  Skin: Negative.   Neurological: Negative for dizziness, syncope, speech difficulty, weakness, light-headedness,  numbness and headaches.  Hematological: Negative.   Psychiatric/Behavioral: Negative for confusion and agitation. The patient is not nervous/anxious.     Allergies  Review of patient's allergies indicates no known allergies.  Home Medications   Current Outpatient Rx  Name Route Sig Dispense Refill  . ALBUTEROL SULFATE HFA 108 (90 BASE) MCG/ACT IN AERS Inhalation Inhale 1-2 puffs into the lungs every 6 (six) hours as needed for wheezing. 1 Inhaler 0  . LISINOPRIL 10 MG PO TABS Oral Take 10 mg by mouth at bedtime.      . ONE-A-DAY MENS PO Oral Take 1 tablet by mouth daily.      Marland Kitchen SALINE NASAL SPRAY NA Nasal Place 1 spray into the nose 2 (two) times daily.     . TETRAHYDROZOLINE HCL 0.05 % OP SOLN Both Eyes Place 2 drops into both eyes daily as needed. For allergy eye symptoms       BP 164/102  Pulse 62  Temp(Src) 98.6 F (37 C) (Oral)  Resp 20  Ht 6\' 2"  (1.88 m)  Wt 295 lb (133.811 kg)  BMI 37.88 kg/m2  SpO2 98%  Physical Exam  Constitutional: He is oriented to person, place, and time. He appears well-developed and well-nourished. No distress.       Uncomfortable appearing.  HENT:  Head: Normocephalic and atraumatic.  Eyes: Conjunctivae and EOM are normal. Pupils are equal, round, and reactive to light.  Neck: Normal range of motion. Neck supple.  Cardiovascular: Normal rate.   Pulmonary/Chest: Effort  normal.  Abdominal: Soft. There is no tenderness.  Musculoskeletal:       Tender with palpation right lumbar area. Muscle spasm noted. Pain with ROM of back.   Neurological: He is alert and oriented to person, place, and time. He has normal reflexes.  Skin: Skin is warm and dry.  Psychiatric: He has a normal mood and affect. His behavior is normal. Judgment and thought content normal.   Assessment: Muscle strain  Plan:  Ibuprofen   Rx flexeril   Follow up with ortho prn   Return as needed.  ED Course  Procedures  MDM          Kerrie Buffalo, NP 12/24/11 [phone]

## 2011-12-24 NOTE — ED Notes (Signed)
Back pain for 2 days, Pt  Has not taken bp med for 2 weeks, "ran out".  Has been taking motrin for pain

## 2012-01-27 ENCOUNTER — Other Ambulatory Visit: Payer: Self-pay

## 2012-01-27 ENCOUNTER — Emergency Department (HOSPITAL_COMMUNITY): Payer: 59

## 2012-01-27 ENCOUNTER — Emergency Department (HOSPITAL_COMMUNITY)
Admission: EM | Admit: 2012-01-27 | Discharge: 2012-01-27 | Disposition: A | Discharge status: Home / Self Care | Payer: 59 | Attending: Emergency Medicine | Admitting: Emergency Medicine

## 2012-01-27 ENCOUNTER — Encounter (HOSPITAL_COMMUNITY): Payer: Self-pay | Admitting: *Deleted

## 2012-01-27 DIAGNOSIS — I10 Essential (primary) hypertension: Secondary | ICD-10-CM | POA: Insufficient documentation

## 2012-01-27 DIAGNOSIS — R42 Dizziness and giddiness: Secondary | ICD-10-CM | POA: Insufficient documentation

## 2012-01-27 DIAGNOSIS — R0789 Other chest pain: Secondary | ICD-10-CM | POA: Insufficient documentation

## 2012-01-27 DIAGNOSIS — H538 Other visual disturbances: Secondary | ICD-10-CM | POA: Insufficient documentation

## 2012-01-27 DIAGNOSIS — R0602 Shortness of breath: Secondary | ICD-10-CM | POA: Insufficient documentation

## 2012-01-27 DIAGNOSIS — J40 Bronchitis, not specified as acute or chronic: Secondary | ICD-10-CM

## 2012-01-27 DIAGNOSIS — Z87891 Personal history of nicotine dependence: Secondary | ICD-10-CM | POA: Insufficient documentation

## 2012-01-27 LAB — DIFFERENTIAL
Basophils Absolute: 0 10*3/uL (ref 0.0–0.1)
Basophils Relative: 1 % (ref 0–1)
Lymphocytes Relative: 28 % (ref 12–46)
Neutro Abs: 4.6 10*3/uL (ref 1.7–7.7)
Neutrophils Relative %: 62 % (ref 43–77)

## 2012-01-27 LAB — POCT I-STAT TROPONIN I: Troponin i, poc: 0.01 ng/mL (ref 0.00–0.08)

## 2012-01-27 LAB — CBC
MCHC: 35.5 g/dL (ref 30.0–36.0)
Platelets: 213 10*3/uL (ref 150–400)
RDW: 13.5 % (ref 11.5–15.5)
WBC: 7.5 10*3/uL (ref 4.0–10.5)

## 2012-01-27 LAB — BASIC METABOLIC PANEL
CO2: 24 mEq/L (ref 19–32)
Chloride: 104 mEq/L (ref 96–112)
GFR calc Af Amer: >90 mL/min (ref >90)
Sodium: 137 mEq/L (ref 135–145)

## 2012-01-27 LAB — D-DIMER, QUANTITATIVE: D-Dimer, Quant: 1.22 ug/mL-FEU — ABNORMAL HIGH (ref 0.00–0.48)

## 2012-01-27 MED ORDER — IOHEXOL 350 MG/ML SOLN
100.0000 mL | Freq: Once | INTRAVENOUS | Status: AC | PRN
  Administered 2012-01-27: 100 mL via INTRAVENOUS

## 2012-01-27 MED ORDER — PREDNISONE 10 MG PO TABS: 20.0000 mg | ORAL_TABLET | Freq: Every day | ORAL | Status: DC

## 2012-01-27 MED ORDER — ALBUTEROL SULFATE HFA 108 (90 BASE) MCG/ACT IN AERS: 2.0000 | INHALATION_SPRAY | RESPIRATORY_TRACT | Status: DC | PRN

## 2012-01-27 MED ORDER — SODIUM CHLORIDE 0.9 % IV SOLN
Freq: Once | INTRAVENOUS | Status: AC
  Administered 2012-01-27: 19:00:00 via INTRAVENOUS

## 2012-01-27 NOTE — Discharge Instructions (Signed)

## 2012-01-27 NOTE — ED Provider Notes (Signed)
History    This chart was scribed for Toy Baker, MD, MD by Smitty Pluck. The patient was seen in room [facility]12 and the patient's care was started at 6:34PM.   CSN: [id number]  Arrival date & time 01/27/12  4098   First MD Initiated Contact with Patient 01/27/12 1821      Chief Complaint  Patient presents with  . Generalized Body Aches  . Dizziness    (Consider location/radiation/quality/duration/timing/severity/associated sxs/prior treatment) The history is provided by the patient.   Cody Harmon is a 46 y.o. male who presents to the Emergency Department complaining of moderate chest pressure and pressure around both eyes radiating towards back of ears bilaterally onset 1 day ago. He reports that he was dizzy with blurred vision and sweaty during onset. The chest pressure started while he was cooking and walking around house. He has never had any symptoms like this before. Pt denies fever, vomiting and diarrhea. Pressure in chest has been constant without radiation. He reports exertion and inhaling deeply aggravates the pain. Notes sinus pressure with some drainage   Past Medical History  Diagnosis Date  . Hypertension   . Gout   . Gout     History reviewed. No pertinent past surgical history.  Family History  Problem Relation Age of Onset  . Hypertension Mother   . Stroke Father   . Stroke Brother   . Osteoarthritis Brother     History  Substance Use Topics  . Smoking status: Former Smoker    Types: Cigars  . Smokeless tobacco: Not on file  . Alcohol Use: No      Review of Systems  All other systems reviewed and are negative.   10 Systems reviewed and are negative for acute change except as noted in the HPI.  Allergies  Review of patient's allergies indicates no known allergies.  Home Medications   Current Outpatient Rx  Name Route Sig Dispense Refill  . ALBUTEROL SULFATE HFA 108 (90 BASE) MCG/ACT IN AERS Inhalation Inhale 1-2 puffs into the lungs  every 6 (six) hours as needed for wheezing. 1 Inhaler 0  . LISINOPRIL 10 MG PO TABS Oral Take 10 mg by mouth at bedtime.      . ONE-A-DAY MENS PO Oral Take 1 tablet by mouth daily.      Marland Kitchen SALINE NASAL SPRAY NA Nasal Place 1 spray into the nose 2 (two) times daily.     . TETRAHYDROZOLINE HCL 0.05 % OP SOLN Both Eyes Place 2 drops into both eyes daily as needed. For allergy eye symptoms       BP 170/103  Pulse 82  Temp(Src) 98.5 F (36.9 C) (Oral)  Resp 20  Ht 6\' 2"  (1.88 m)  Wt 285 lb (129.275 kg)  BMI 36.59 kg/m2  SpO2 98%  Physical Exam  Nursing note and vitals reviewed. Constitutional: He is oriented to person, place, and time. He appears well-developed and well-nourished. No distress.  HENT:  Head: Normocephalic and atraumatic.       Slight tenderness to palpation to maxilla sinuses   Eyes: EOM are normal. Pupils are equal, round, and reactive to light.  Neck: Normal range of motion. Neck supple. No tracheal deviation present.  Cardiovascular: Normal rate, regular rhythm and normal heart sounds.   Pulmonary/Chest: Effort normal. No respiratory distress.  Abdominal: Soft. He exhibits no distension.  Musculoskeletal: Normal range of motion.  Neurological: He is alert and oriented to person, place, and time.  Skin: Skin is  warm and dry.  Psychiatric: He has a normal mood and affect. His behavior is normal.    ED Course  Procedures (including critical care time) DIAGNOSTIC STUDIES: Oxygen Saturation is 98% on room air, normal by my interpretation.    COORDINATION OF CARE: 6:40PM EDP ordered medication: 0.9% NaCl infusion   Labs Reviewed  D-DIMER, QUANTITATIVE - Abnormal; Notable for the following:    D-Dimer, Quant 1.22 (*)    All other components within normal limits  BASIC METABOLIC PANEL - Abnormal; Notable for the following:    Glucose, Bld 110 (*)    GFR calc non Af Amer 82 (*)    All other components within normal limits  CBC  DIFFERENTIAL  POCT I-STAT  TROPONIN I   Dg Chest 2 View  01/27/2012  *RADIOLOGY REPORT*  Clinical Data: Chest pain.  Hypertension.  CHEST - 2 VIEW  Comparison:  07/11/2011  Findings:  The heart size and mediastinal contours are within normal limits.  Both lungs are clear.  The visualized skeletal structures are unremarkable.  IMPRESSION: No active cardiopulmonary disease.  Original Report Authenticated By: Danae Orleans, M.D.   Ct Angio Chest W/cm &/or Wo Cm  01/27/2012  *RADIOLOGY REPORT*  Clinical Data: Chest pain and shortness breath.  Elevated D-dimer.  CT ANGIOGRAPHY CHEST  Technique:  Multidetector CT imaging of the chest using the standard protocol during bolus administration of intravenous contrast. Multiplanar reconstructed images including MIPs were obtained and reviewed to evaluate the vascular anatomy.  Contrast: OMNIPAQUE IOHEXOL 350 MG/ML IV SOLN  Comparison: None.  Findings: Satisfactory opacification of the pulmonary arteries noted, and there is no evidence of pulmonary emboli.  No evidence of thoracic aortic aneurysm or dissection.  No evidence of mediastinal hematoma or mass.  No adenopathy seen elsewhere within the thorax.  No evidence of pleural or pericardial effusion.  Both lungs are clear.  No evidence of pulmonary infiltrate or mass.  No central endobronchial lesion identified.  IMPRESSION: Negative.  No evidence of pulmonary embolism or other active disease.  Original Report Authenticated By: Danae Orleans, M.D.     No diagnosis found.    MDM  I personally performed the services described in this documentation, which was scribed in my presence. The recorded information has been reviewed and considered.     8:28 PM Patient with elevated d-dimer and subsequently had a chest CT which was negative for pulmonary embolism. Patient with sinus symptoms x2 days. Doubt patient has sinusitis. Patient's chest discomfort is worse with deep breathing and inhalation. Suspect bronchitis we'll  treat  Toy Baker, MD 01/27/12 2029

## 2012-01-27 NOTE — ED Notes (Signed)
Pt reports generalized body aches.  States most pain is in head, behind eyes and fullness in ears.  Pt does report minor dizziness.  No nausea or vomiting.

## 2012-01-27 NOTE — ED Notes (Signed)
Reports waking up yesterday with sinus pressure and chest pressure, body aches, and dizziness.  Denies cough, fever.

## 2012-01-30 ENCOUNTER — Encounter (HOSPITAL_COMMUNITY): Payer: Self-pay | Admitting: Emergency Medicine

## 2012-01-30 ENCOUNTER — Emergency Department (HOSPITAL_COMMUNITY)
Admission: EM | Admit: 2012-01-30 | Discharge: 2012-01-30 | Disposition: A | Discharge status: Home / Self Care | Payer: 59 | Attending: Emergency Medicine | Admitting: Emergency Medicine

## 2012-01-30 DIAGNOSIS — I10 Essential (primary) hypertension: Secondary | ICD-10-CM | POA: Insufficient documentation

## 2012-01-30 DIAGNOSIS — J329 Chronic sinusitis, unspecified: Secondary | ICD-10-CM | POA: Insufficient documentation

## 2012-01-30 MED ORDER — HYDROCHLOROTHIAZIDE 25 MG PO TABS: 25.0000 mg | ORAL_TABLET | Freq: Every day | ORAL | Status: DC

## 2012-01-30 MED ORDER — MECLIZINE HCL 50 MG PO TABS: 25.0000 mg | ORAL_TABLET | Freq: Three times a day (TID) | ORAL | Status: AC | PRN

## 2012-01-30 MED ORDER — LISINOPRIL 20 MG PO TABS: 10.0000 mg | ORAL_TABLET | Freq: Every day | ORAL | Status: DC

## 2012-01-30 MED ORDER — PSEUDOEPHEDRINE HCL ER 120 MG PO TB12: 120.0000 mg | ORAL_TABLET | Freq: Two times a day (BID) | ORAL | Status: DC

## 2012-01-30 NOTE — ED Notes (Signed)
Pt c/o sinus infection with dizziness x 3 days.

## 2012-01-30 NOTE — ED Provider Notes (Addendum)
History   This chart was scribed for Celene Kras, MD by Sofie Rower. The patient was seen in room APA12/APA12 and the patient's care was started at 7:48AM.    CSN: [id number]  Arrival date & time 01/30/12  0727   First MD Initiated Contact with Patient 01/30/12 [phone]      Chief Complaint  Patient presents with  . Sinusitis    (Consider location/radiation/quality/duration/timing/severity/associated sxs/prior treatment) HPI  Cody Harmon is a 46 y.o. male who presents to the Emergency Department complaining of moderate, constant sinus infection onset three days with associated symptoms of dizziness, pain in eyes. Patient feels pressure sinus region.  Pt has been seen for similar symptoms recently. Rx albuterol and prednisone. Patient denies fever. He's not had any difficulty with his breathing at this time. He states when he was seen the other day they did not exam sinus.  Patient has not filled any prescriptions he was given previously  PCP is Dr. Phillips Odor.    Past Medical History  Diagnosis Date  . Hypertension   . Gout   . Gout     History reviewed. No pertinent past surgical history.  Family History  Problem Relation Age of Onset  . Hypertension Mother   . Stroke Father   . Stroke Brother   . Osteoarthritis Brother     History  Substance Use Topics  . Smoking status: Former Smoker    Types: Cigars  . Smokeless tobacco: Not on file  . Alcohol Use: No      Review of Systems  All other systems reviewed and are negative.    10 Systems reviewed and are negative for acute change except as noted in the HPI.   Allergies  Review of patient's allergies indicates no known allergies.  Home Medications   Current Outpatient Rx  Name Route Sig Dispense Refill  . ALBUTEROL SULFATE HFA 108 (90 BASE) MCG/ACT IN AERS Inhalation Inhale 2 puffs into the lungs every 4 (four) hours as needed for wheezing. 1 Inhaler 0  . ONE-A-DAY MENS PO Oral Take 1 tablet by mouth daily.       Marland Kitchen PHENYLEPH-DOXYLAMINE-DM-APAP 5-6.25-10-325 MG PO CAPS Oral Take 2 capsules by mouth as needed. FOR SYMPTOMS    . PREDNISONE 10 MG PO TABS Oral Take 2 tablets (20 mg total) by mouth daily. 10 tablet 0  . SALINE NASAL SPRAY NA Nasal Place 1 spray into the nose 2 (two) times daily.       BP 190/117  Pulse 75  Temp 98.9 F (37.2 C)  Resp 20  Ht 6\' 2"  (1.88 m)  Wt 285 lb (129.275 kg)  BMI 36.59 kg/m2  SpO2 95%  Physical Exam  Nursing note and vitals reviewed. Constitutional: He appears well-developed and well-nourished. No distress.  HENT:  Head: Normocephalic and atraumatic.  Right Ear: External ear normal.  Left Ear: External ear normal.  Eyes: Conjunctivae are normal. Right eye exhibits no discharge. Left eye exhibits no discharge. No scleral icterus.  Neck: Neck supple. No tracheal deviation present.  Cardiovascular: Normal rate, regular rhythm and intact distal pulses.   Pulmonary/Chest: Effort normal and breath sounds normal. No stridor. No respiratory distress. He has no wheezes. He has no rales.  Abdominal: Soft. Bowel sounds are normal. He exhibits no distension. There is no tenderness. There is no rebound and no guarding.  Musculoskeletal: He exhibits no edema and no tenderness.  Neurological: He is alert. He has normal strength. No sensory deficit. Cranial nerve  deficit:  no gross defecits noted. He exhibits normal muscle tone. He displays no seizure activity. Coordination normal.  Skin: Skin is warm and dry. No rash noted.  Psychiatric: He has a normal mood and affect.    ED Course  Procedures (including critical care time)  DIAGNOSTIC STUDIES: Oxygen Saturation is 95% on room air, adequate by my interpretation.    COORDINATION OF CARE:     7:50AM- EDP at bedside discusses treatment plan concerning decongestant medication and appointment with PCP.  MDM  Patient with mild symptoms of sinus inflammation. He has no fever. I doubt bacterial sinusitis. He has  no abnormal neurologic findings on exam to suggest a central cause for his dizziness. Of note the patient is hypertensive. The deep congested medications will exacerbate this. I will prescribe him a course of antihypertensives instructed to follow up with his primary Dr. He does have history of hypertension.  Patient states he has not been seeing her primary doctor although he is to go to [facility]. He stopped taking his blood pressure medications when they ran out. He cannot recall the exact dosages but thinks he did take lisinopril.        I personally performed the services described in this documentation, which was scribed in my presence.  The recorded information has been reviewed and considered.    Celene Kras, MD 01/30/12 1610  Celene Kras, MD 01/30/12 [phone]

## 2012-01-30 NOTE — Discharge Instructions (Signed)

## 2012-01-30 NOTE — ED Notes (Signed)
Pt sick for 3 days with wsinus congestion and pain.

## 2012-03-04 ENCOUNTER — Encounter (HOSPITAL_COMMUNITY): Payer: Self-pay | Admitting: Emergency Medicine

## 2012-03-04 ENCOUNTER — Emergency Department (HOSPITAL_COMMUNITY): Payer: 59

## 2012-03-04 ENCOUNTER — Emergency Department (HOSPITAL_COMMUNITY)
Admission: EM | Admit: 2012-03-04 | Discharge: 2012-03-04 | Disposition: A | Discharge status: Home / Self Care | Payer: 59 | Attending: Emergency Medicine | Admitting: Emergency Medicine

## 2012-03-04 DIAGNOSIS — M79609 Pain in unspecified limb: Secondary | ICD-10-CM | POA: Insufficient documentation

## 2012-03-04 DIAGNOSIS — S6000XA Contusion of unspecified finger without damage to nail, initial encounter: Secondary | ICD-10-CM | POA: Insufficient documentation

## 2012-03-04 DIAGNOSIS — M25449 Effusion, unspecified hand: Secondary | ICD-10-CM | POA: Insufficient documentation

## 2012-03-04 DIAGNOSIS — Z87891 Personal history of nicotine dependence: Secondary | ICD-10-CM | POA: Insufficient documentation

## 2012-03-04 DIAGNOSIS — X58XXXA Exposure to other specified factors, initial encounter: Secondary | ICD-10-CM | POA: Insufficient documentation

## 2012-03-04 DIAGNOSIS — I10 Essential (primary) hypertension: Secondary | ICD-10-CM | POA: Insufficient documentation

## 2012-03-04 MED ORDER — IBUPROFEN 800 MG PO TABS: 800.0000 mg | ORAL_TABLET | Freq: Three times a day (TID) | ORAL | Status: AC

## 2012-03-04 MED ORDER — HYDROCODONE-ACETAMINOPHEN 5-325 MG PO TABS: ORAL_TABLET | ORAL | Status: AC

## 2012-03-04 NOTE — Discharge Instructions (Signed)

## 2012-03-04 NOTE — ED Notes (Signed)
Pt with crush injury to R hand, index finger

## 2012-03-04 NOTE — ED Provider Notes (Signed)
History     CSN: [id number]  Arrival date & time 03/04/12  1105   First MD Initiated Contact with Patient 03/04/12 1118      Chief Complaint  Patient presents with  . Finger Injury    (Consider location/radiation/quality/duration/timing/severity/associated sxs/prior treatment) Patient is a 46 y.o. male presenting with hand pain. The history is provided by the patient.  Hand Pain This is a new problem. The current episode started today. The problem occurs constantly. The problem has been unchanged. Associated symptoms include arthralgias and joint swelling. Pertinent negatives include no fever, nausea, neck pain, numbness or weakness. The symptoms are aggravated by bending (movement and palpation). He has tried nothing for the symptoms.    Past Medical History  Diagnosis Date  . Hypertension   . Gout   . Gout     History reviewed. No pertinent past surgical history.  Family History  Problem Relation Age of Onset  . Hypertension Mother   . Stroke Father   . Stroke Brother   . Osteoarthritis Brother     History  Substance Use Topics  . Smoking status: Former Smoker    Types: Cigars  . Smokeless tobacco: Not on file  . Alcohol Use: No      Review of Systems  Constitutional: Negative for fever.  HENT: Negative for neck pain.   Gastrointestinal: Negative for nausea.  Musculoskeletal: Positive for joint swelling and arthralgias. Negative for gait problem.  Skin: Negative for color change, pallor and wound.  Neurological: Negative for weakness and numbness.  All other systems reviewed and are negative.    Allergies  Review of patient's allergies indicates no known allergies.  Home Medications   Current Outpatient Rx  Name Route Sig Dispense Refill  . ALBUTEROL SULFATE HFA 108 (90 BASE) MCG/ACT IN AERS Inhalation Inhale 2 puffs into the lungs every 4 (four) hours as needed for wheezing. 1 Inhaler 0  . HYDROCHLOROTHIAZIDE 25 MG PO TABS Oral Take 1 tablet (25 mg  total) by mouth daily. 30 tablet 0  . LISINOPRIL 20 MG PO TABS Oral Take 0.5 tablets (10 mg total) by mouth daily. 30 tablet 0  . ONE-A-DAY MENS PO Oral Take 1 tablet by mouth daily.      Marland Kitchen PREDNISONE 10 MG PO TABS Oral Take 2 tablets (20 mg total) by mouth daily. 10 tablet 0  . PSEUDOEPHEDRINE HCL ER 120 MG PO TB12 Oral Take 1 tablet (120 mg total) by mouth every 12 (twelve) hours. 14 tablet 0  . SALINE NASAL SPRAY NA Nasal Place 1 spray into the nose 2 (two) times daily.       BP 167/101  Pulse 61  Temp(Src) 98 F (36.7 C) (Oral)  Resp 16  Ht 6\' 2"  (1.88 m)  Wt 280 lb (127.007 kg)  BMI 35.95 kg/m2  SpO2 100%  Physical Exam  Nursing note and vitals reviewed. Constitutional: He is oriented to person, place, and time. He appears well-developed and well-nourished. No distress.  HENT:  Head: Normocephalic and atraumatic.  Mouth/Throat: Oropharynx is clear and moist.  Cardiovascular: Normal rate, regular rhythm and normal heart sounds.   Pulmonary/Chest: Effort normal and breath sounds normal. No respiratory distress.  Musculoskeletal: He exhibits tenderness. He exhibits no edema.       Right hand: He exhibits decreased range of motion, tenderness and bony tenderness. He exhibits normal two-point discrimination, normal capillary refill, no deformity, no laceration and no swelling. normal sensation noted. Normal strength noted.  Hands: Neurological: He is alert and oriented to person, place, and time. He exhibits normal muscle tone. Coordination normal.  Skin: Skin is warm and dry.    ED Course  Procedures (including critical care time)  Labs Reviewed - No data to display Dg Finger Index Right  03/04/2012  *RADIOLOGY REPORT*  Clinical Data: Crush injury yesterday, now with knuckle pain  RIGHT INDEX FINGER 2+V  Comparison: Right wrist radiographs - 04/30/2007  Findings:  No fracture or dislocation.  Possible minimal soft tissue swelling about the PIP joint of the index/pointer  finger.  Minimal degenerative change of the MCP joint with minimal osteophytosis. No radiopaque foreign body.  IMPRESSION: 1.  Minimal soft tissue swelling about the PIP joint of the index/pointer finger without associated fracture or radiopaque foreign body.  2.  Minimal degenerative change of the MCP joint of the index/pointer finger.  Original Report Authenticated By: Waynard Reeds, M.D.     Index finger was splinted by nursing.  Pain improved.  Remains NV intact.   MDM     ttp of the PIP of the right index finger.  Mild bruising present.  Pt has full ROM of the finger but reproduces pain.  Radial pulse is brisk, sensation intact.  CR<2 sec Pt agrees to f/u with orthopedics if not improving.     Patient / Family / Caregiver understand and agree with initial ED impression and plan with expectations set for ED visit. Pt stable in ED with no significant deterioration in condition. Pt feels improved after observation and/or treatment in ED.      Irvine Glorioso L. Lainy Wrobleski, [place] 03/09/12 1433

## 2012-03-12 NOTE — ED Provider Notes (Signed)
Medical screening examination/treatment/procedure(s) were performed by non-physician practitioner and as supervising physician I was immediately available for consultation/collaboration.   Kristian Mogg L Alenah Sarria, MD 03/12/12 1818 

## 2012-03-27 ENCOUNTER — Emergency Department (HOSPITAL_COMMUNITY)
Admission: EM | Admit: 2012-03-27 | Discharge: 2012-03-27 | Disposition: A | Discharge status: Home / Self Care | Payer: 59 | Attending: Emergency Medicine | Admitting: Emergency Medicine

## 2012-03-27 ENCOUNTER — Encounter (HOSPITAL_COMMUNITY): Payer: Self-pay | Admitting: *Deleted

## 2012-03-27 DIAGNOSIS — X58XXXA Exposure to other specified factors, initial encounter: Secondary | ICD-10-CM | POA: Insufficient documentation

## 2012-03-27 DIAGNOSIS — I10 Essential (primary) hypertension: Secondary | ICD-10-CM | POA: Insufficient documentation

## 2012-03-27 DIAGNOSIS — M7662 Achilles tendinitis, left leg: Secondary | ICD-10-CM

## 2012-03-27 DIAGNOSIS — M7660 Achilles tendinitis, unspecified leg: Secondary | ICD-10-CM | POA: Insufficient documentation

## 2012-03-27 MED ORDER — DICLOFENAC SODIUM 75 MG PO TBEC: 75.0000 mg | DELAYED_RELEASE_TABLET | Freq: Two times a day (BID) | ORAL | Status: DC

## 2012-03-27 MED ORDER — ONDANSETRON HCL 4 MG PO TABS
4.0000 mg | ORAL_TABLET | Freq: Once | ORAL | Status: AC
  Administered 2012-03-27: 4 mg via ORAL
  Filled 2012-03-27: qty 1

## 2012-03-27 MED ORDER — METHYLPREDNISOLONE SODIUM SUCC 125 MG IJ SOLR
125.0000 mg | Freq: Once | INTRAMUSCULAR | Status: AC
  Administered 2012-03-27: 125 mg via INTRAMUSCULAR
  Filled 2012-03-27: qty 2

## 2012-03-27 MED ORDER — OXYCODONE-ACETAMINOPHEN 5-325 MG PO TABS
1.0000 | ORAL_TABLET | Freq: Once | ORAL | Status: AC
  Administered 2012-03-27: 1 via ORAL
  Filled 2012-03-27: qty 1

## 2012-03-27 MED ORDER — OXYCODONE-ACETAMINOPHEN 5-325 MG PO TABS: 1.0000 | ORAL_TABLET | Freq: Four times a day (QID) | ORAL | Status: AC | PRN

## 2012-03-27 MED ORDER — IBUPROFEN 800 MG PO TABS
800.0000 mg | ORAL_TABLET | Freq: Once | ORAL | Status: AC
  Administered 2012-03-27: 800 mg via ORAL
  Filled 2012-03-27: qty 1

## 2012-03-27 NOTE — ED Provider Notes (Signed)
Medical screening examination/treatment/procedure(s) were performed by non-physician practitioner and as supervising physician I was immediately available for consultation/collaboration.   Shelda Jakes, MD 03/27/12 [phone]

## 2012-03-27 NOTE — ED Provider Notes (Signed)
History     CSN: [id number]  Arrival date & time 03/27/12  1153   None     Chief Complaint  Patient presents with  . Ankle Pain    (Consider location/radiation/quality/duration/timing/severity/associated sxs/prior treatment) Patient is a 46 y.o. male presenting with ankle pain. The history is provided by the patient.  Ankle Pain  The incident occurred more than 2 days ago. The incident occurred at work. Injury mechanism: walking and climbing. The pain is present in the left heel. Quality: deep ache. The pain is severe. The pain has been worsening since onset. Pertinent negatives include no numbness, no inability to bear weight, no loss of sensation and no tingling. He reports no foreign bodies present. The symptoms are aggravated by bearing weight. He has tried nothing for the symptoms.    Past Medical History  Diagnosis Date  . Hypertension   . Gout   . Gout     History reviewed. No pertinent past surgical history.  Family History  Problem Relation Age of Onset  . Hypertension Mother   . Stroke Father   . Stroke Brother   . Osteoarthritis Brother     History  Substance Use Topics  . Smoking status: Former Smoker    Types: Cigars  . Smokeless tobacco: Not on file  . Alcohol Use: No      Review of Systems  Constitutional: Negative for activity change.       All ROS Neg except as noted in HPI  HENT: Negative for nosebleeds and neck pain.   Eyes: Negative for photophobia and discharge.  Respiratory: Negative for cough, shortness of breath and wheezing.   Cardiovascular: Negative for chest pain and palpitations.  Gastrointestinal: Negative for abdominal pain and blood in stool.  Genitourinary: Negative for dysuria, frequency and hematuria.  Musculoskeletal: Positive for arthralgias. Negative for back pain.  Skin: Negative.   Neurological: Negative for dizziness, tingling, seizures, speech difficulty and numbness.  Psychiatric/Behavioral: Negative for  hallucinations and confusion.    Allergies  Review of patient's allergies indicates no known allergies.  Home Medications   Current Outpatient Rx  Name Route Sig Dispense Refill  . ALBUTEROL SULFATE HFA 108 (90 BASE) MCG/ACT IN AERS Inhalation Inhale 2 puffs into the lungs every 4 (four) hours as needed for wheezing. 1 Inhaler 0  . HYDROCHLOROTHIAZIDE 25 MG PO TABS Oral Take 1 tablet (25 mg total) by mouth daily. 30 tablet 0  . LISINOPRIL 20 MG PO TABS Oral Take 0.5 tablets (10 mg total) by mouth daily. 30 tablet 0  . ONE-A-DAY MENS PO Oral Take 1 tablet by mouth daily.      Marland Kitchen PREDNISONE 10 MG PO TABS Oral Take 2 tablets (20 mg total) by mouth daily. 10 tablet 0  . PSEUDOEPHEDRINE HCL ER 120 MG PO TB12 Oral Take 1 tablet (120 mg total) by mouth every 12 (twelve) hours. 14 tablet 0  . SALINE NASAL SPRAY NA Nasal Place 1 spray into the nose 2 (two) times daily.       BP 154/105  Pulse 75  Temp(Src) 98.1 F (36.7 C) (Oral)  Resp 18  Ht 6\' 2"  (1.88 m)  Wt 285 lb (129.275 kg)  BMI 36.59 kg/m2  SpO2 99%  Physical Exam  Nursing note and vitals reviewed. Constitutional: He is oriented to person, place, and time. He appears well-developed and well-nourished.  Non-toxic appearance.  HENT:  Head: Normocephalic.  Right Ear: Tympanic membrane and external ear normal.  Left Ear: Tympanic membrane  and external ear normal.  Eyes: EOM and lids are normal. Pupils are equal, round, and reactive to light.  Neck: Normal range of motion. Neck supple. Carotid bruit is not present.  Cardiovascular: Normal rate, regular rhythm, normal heart sounds, intact distal pulses and normal pulses.   Pulmonary/Chest: Breath sounds normal. No respiratory distress.  Abdominal: Soft. Bowel sounds are normal. There is no tenderness. There is no guarding.  Musculoskeletal: Normal range of motion.       There is pain to palpation, as well as pain to attempted range of motion of the left heel and ankle area. The  Achilles tendon is intact. The area is not hot. dorsalis pedis pulses are symmetrical. No puncture wounds to the plantar surface of the left foot.  Lymphadenopathy:       Head (right side): No submandibular adenopathy present.       Head (left side): No submandibular adenopathy present.    He has no cervical adenopathy.  Neurological: He is alert and oriented to person, place, and time. He has normal strength. No cranial nerve deficit or sensory deficit.  Skin: Skin is warm and dry.  Psychiatric: He has a normal mood and affect. His speech is normal.    ED Course  Procedures (including critical care time)  Labs Reviewed - No data to display No results found.   No diagnosis found.    MDM  I have reviewed nursing notes, vital signs, and all appropriate lab and imaging results for this patient. Vital signs are within normal limits with exception of the blood pressure being elevated at 154/105. The patient has tendinitis of the Achilles tendon on examination. This will be treated with pain medication and anti-inflammatory medication. The patient will also be fitted with an ankle stirrup splint for 10-14 days.       Kathie Dike, [place] 03/27/12 1324

## 2012-03-27 NOTE — ED Notes (Signed)
Reports woke up with pain to back of left ankle this morning.  Denies injury.

## 2012-03-27 NOTE — ED Notes (Signed)
Pt a/ox4. Respirations even and unlabored. NAD at this time. D/C instructions reviewed with pt. Pt verbalized understanding. Pt ambulated to d/c desk with steady gate. Pt verbalized understanding. Regarding no driving for 6 hrs after d/c. Pt states he has a ride.

## 2012-03-27 NOTE — Discharge Instructions (Signed)
Please elevate the left lower do it. He is much as possible, please rest your left ankle is much as possible. Please use the ankle splint over the next 10-14 days. Please do not sleep in the device. All care and 2 times daily after a meal. Use Roxicet every 6 hours as needed for pain, as medication may cause drowsiness and/or constipation, please use with caution.Please see Dr Romeo Apple for additonal evaluation if not improving. Thanks.   Achilles Tendinitis Tendinitis a swelling and soreness of the tendon. The pain in the tendon (cord-like structure which attaches muscle to bone) is produced by tiny tears and the inflammation present in that tendon. It commonly occurs at the shoulders, heels, and elbows. It is usually caused by overusing the tendon and joint involved. Achilles tendinitis involves the Achilles tendon. This is the large tendon in the back of the leg just above the foot. It attaches the large muscles of the lower leg to the heel bone (called calcaneus).  This diagnosis (learning what is wrong) is made by examination. X-rays will be generally be normal if only tendinitis is present. HOME CARE INSTRUCTIONS   Apply ice to the injury for 15 to 20 minutes, 3 to 4 times per day. Put the ice in a plastic bag and place a towel between the bag of ice and your skin.   Try to avoid use other than gentle range of motion while the tendon is painful. Do not resume use until instructed by your caregiver. Then begin use gradually. Do not increase use to the point of pain. If pain does develop, decrease use and continue the above measures. Gradually increase activities that do not cause discomfort until you gradually achieve normal use.   Only take over-the-counter or prescription medicines for pain, discomfort, or fever as directed by your caregiver.  SEEK MEDICAL CARE IF:   Your pain and swelling increase or pain is uncontrolled with medications.   You develop new, unexplained problems (symptoms) or an  increase of the symptoms that brought you to your caregiver.   You develop an inability to move your toes or foot, develop warmth and swelling in your foot, or begin running an unexplained temperature.  MAKE SURE YOU:   Understand these instructions.   Will watch your condition.   Will get help right away if you are not doing well or get worse.  Document Released: 08/27/2005 Document Revised: 11/06/2011 Document Reviewed: 07/05/2008 [facility] Patient Information 2012 [place], [place].

## 2012-10-09 ENCOUNTER — Emergency Department (HOSPITAL_COMMUNITY): Payer: 59

## 2012-10-09 ENCOUNTER — Encounter (HOSPITAL_COMMUNITY): Payer: Self-pay | Admitting: *Deleted

## 2012-10-09 ENCOUNTER — Emergency Department (HOSPITAL_COMMUNITY)
Admission: EM | Admit: 2012-10-09 | Discharge: 2012-10-09 | Disposition: A | Discharge status: Home / Self Care | Payer: 59 | Attending: Emergency Medicine | Admitting: Emergency Medicine

## 2012-10-09 DIAGNOSIS — R51 Headache: Secondary | ICD-10-CM

## 2012-10-09 DIAGNOSIS — I10 Essential (primary) hypertension: Secondary | ICD-10-CM | POA: Insufficient documentation

## 2012-10-09 DIAGNOSIS — B9789 Other viral agents as the cause of diseases classified elsewhere: Secondary | ICD-10-CM | POA: Insufficient documentation

## 2012-10-09 DIAGNOSIS — Z79899 Other long term (current) drug therapy: Secondary | ICD-10-CM | POA: Insufficient documentation

## 2012-10-09 DIAGNOSIS — J040 Acute laryngitis: Secondary | ICD-10-CM

## 2012-10-09 DIAGNOSIS — B349 Viral infection, unspecified: Secondary | ICD-10-CM

## 2012-10-09 DIAGNOSIS — J029 Acute pharyngitis, unspecified: Secondary | ICD-10-CM

## 2012-10-09 DIAGNOSIS — R079 Chest pain, unspecified: Secondary | ICD-10-CM

## 2012-10-09 DIAGNOSIS — Z87891 Personal history of nicotine dependence: Secondary | ICD-10-CM | POA: Insufficient documentation

## 2012-10-09 LAB — CBC WITH DIFFERENTIAL/PLATELET
Basophils Absolute: 0 10*3/uL (ref 0.0–0.1)
HCT: 44 % (ref 39.0–52.0)
Hemoglobin: 15.3 g/dL (ref 13.0–17.0)
Lymphocytes Relative: 34 % (ref 12–46)
Monocytes Absolute: 0.6 10*3/uL (ref 0.1–1.0)
Monocytes Relative: 8 % (ref 3–12)
Neutro Abs: 4.1 10*3/uL (ref 1.7–7.7)
RDW: 13.8 % (ref 11.5–15.5)
WBC: 7.4 10*3/uL (ref 4.0–10.5)

## 2012-10-09 LAB — COMPREHENSIVE METABOLIC PANEL
ALT: 24 U/L (ref 0–53)
AST: 24 U/L (ref 0–37)
Alkaline Phosphatase: 86 U/L (ref 39–117)
CO2: 24 mEq/L (ref 19–32)
Chloride: 104 mEq/L (ref 96–112)
Creatinine, Ser: 1.05 mg/dL (ref 0.50–1.35)
GFR calc non Af Amer: 83 mL/min — ABNORMAL LOW (ref >90)
Total Bilirubin: 0.8 mg/dL (ref 0.3–1.2)

## 2012-10-09 LAB — RAPID STREP SCREEN (MED CTR MEBANE ONLY): Streptococcus, Group A Screen (Direct): NEGATIVE

## 2012-10-09 LAB — TROPONIN I: Troponin I: <0.3 ng/mL (ref <0.30)

## 2012-10-09 MED ORDER — ASPIRIN 325 MG PO TABS
325.0000 mg | ORAL_TABLET | Freq: Once | ORAL | Status: AC
  Administered 2012-10-09: 325 mg via ORAL
  Filled 2012-10-09: qty 1

## 2012-10-09 MED ORDER — IBUPROFEN 800 MG PO TABS
800.0000 mg | ORAL_TABLET | Freq: Once | ORAL | Status: AC
  Administered 2012-10-09: 800 mg via ORAL
  Filled 2012-10-09: qty 1

## 2012-10-09 MED ORDER — METOCLOPRAMIDE HCL 5 MG/ML IJ SOLN
10.0000 mg | Freq: Once | INTRAMUSCULAR | Status: AC
  Administered 2012-10-09: 10 mg via INTRAMUSCULAR

## 2012-10-09 MED ORDER — METOCLOPRAMIDE HCL 5 MG/ML IJ SOLN
10.0000 mg | Freq: Once | INTRAMUSCULAR | Status: DC
  Filled 2012-10-09: qty 2

## 2012-10-09 MED ORDER — ALBUTEROL SULFATE HFA 108 (90 BASE) MCG/ACT IN AERS: 2.0000 | INHALATION_SPRAY | RESPIRATORY_TRACT | Status: DC | PRN

## 2012-10-09 MED ORDER — DIPHENHYDRAMINE HCL 50 MG/ML IJ SOLN
50.0000 mg | Freq: Once | INTRAMUSCULAR | Status: AC
  Administered 2012-10-09: 50 mg via INTRAMUSCULAR
  Filled 2012-10-09: qty 1

## 2012-10-09 NOTE — ED Notes (Signed)
Woke up with sore throat, "hurts to take a deep breath" Denies cough. Pt also states headache with dizziness at times also.

## 2012-10-09 NOTE — ED Provider Notes (Signed)
History     CSN: [id number]  Arrival date & time 10/09/12  1625   First MD Initiated Contact with Patient 10/09/12 1630      Chief Complaint  Patient presents with  . Dizziness  . Headache  . Sore Throat    (Consider location/radiation/quality/duration/timing/severity/associated sxs/prior treatment) HPI  Patient reports last night he started feeling bad. He reports today at work things got worse. He states he has a painful throat when he swallows and his voice is also hoarse. He denies fever or rhinorrhea or coughing. He states he has mild pressure in his ears. He denies being around anybody else who is sick. He denies having a lot of problems with his throat in the past. He also states he has been getting chest pressure in the center of his chest off and on all day today. He states the pains can last up to an hour. The last episode was a couple of hours ago. He states he's never had this before. He states nothing he does makes the chest pain come on or get worse, and nothing he does makes the chest pain go away or feel better.  He also states the top of his head is throbbing today.  PCP Dr Phillips Odor  Past Medical History  Diagnosis Date  . Hypertension   . Gout   . Gout     History reviewed. No pertinent past surgical history.  Family History  Problem Relation Age of Onset  . Hypertension Mother   . Stroke Father   . Stroke Brother   . Osteoarthritis Brother   MOP gout and arthritis FOP died of stroke in 47's PGF died of stroke in his 56's  History  Substance Use Topics  . Smoking status: Former Smoker    Types: Cigars  . Smokeless tobacco: Not on file  . Alcohol Use: No   employed   Review of Systems  All other systems reviewed and are negative.    Allergies  Review of patient's allergies indicates no known allergies.  Home Medications   Current Outpatient Rx  Name  Route  Sig  Dispense  Refill  . ALBUTEROL SULFATE HFA 108 (90 BASE) MCG/ACT IN AERS  Inhalation   Inhale 2 puffs into the lungs every 4 (four) hours as needed for wheezing.   1 Inhaler   0   . DICLOFENAC SODIUM 75 MG PO TBEC   Oral   Take 1 tablet (75 mg total) by mouth 2 (two) times daily.   12 tablet   0   . HYDROCHLOROTHIAZIDE 25 MG PO TABS   Oral   Take 1 tablet (25 mg total) by mouth daily.   30 tablet   0   . LISINOPRIL 20 MG PO TABS   Oral   Take 0.5 tablets (10 mg total) by mouth daily.   30 tablet   0   . ONE-A-DAY MENS PO   Oral   Take 1 tablet by mouth daily.           Marland Kitchen PREDNISONE 10 MG PO TABS   Oral   Take 2 tablets (20 mg total) by mouth daily.   10 tablet   0   . PSEUDOEPHEDRINE HCL ER 120 MG PO TB12   Oral   Take 1 tablet (120 mg total) by mouth every 12 (twelve) hours.   14 tablet   0   . SALINE NASAL SPRAY NA   Nasal   Place 1 spray into the nose  2 (two) times daily.            BP 150/113  Pulse 76  Temp 98.9 F (37.2 C) (Oral)  Resp 16  Ht 6\' 2"  (1.88 m)  Wt 285 lb (129.275 kg)  BMI 36.59 kg/m2  SpO2 96%  Vital signs normal except diastolic hypertension   Physical Exam  Nursing note and vitals reviewed. Constitutional: He is oriented to person, place, and time. He appears well-developed and well-nourished.  Non-toxic appearance. He does not appear ill. No distress.  HENT:  Head: Normocephalic and atraumatic.  Right Ear: External ear normal.  Left Ear: External ear normal.  Nose: Nose normal. No mucosal edema or rhinorrhea.  Mouth/Throat: Uvula is midline, oropharynx is clear and moist and mucous membranes are normal. No dental abscesses or uvula swelling. No oropharyngeal exudate, posterior oropharyngeal edema, posterior oropharyngeal erythema or tonsillar abscesses.  Eyes: Conjunctivae normal and EOM are normal. Pupils are equal, round, and reactive to light.  Neck: Normal range of motion and full passive range of motion without pain. Neck supple.  Cardiovascular: Normal rate, regular rhythm and normal  heart sounds.  Exam reveals no gallop and no friction rub.   No murmur heard. Pulmonary/Chest: Effort normal and breath sounds normal. No respiratory distress. He has no wheezes. He has no rhonchi. He has no rales. He exhibits no tenderness and no crepitus.  Abdominal: Soft. Normal appearance and bowel sounds are normal. He exhibits no distension. There is no tenderness. There is no rebound and no guarding.  Musculoskeletal: Normal range of motion. He exhibits no edema and no tenderness.       Moves all extremities well.   Neurological: He is alert and oriented to person, place, and time. He has normal strength. No cranial nerve deficit.  Skin: Skin is warm, dry and intact. No rash noted. No erythema. No pallor.  Psychiatric: He has a normal mood and affect. His speech is normal and behavior is normal. His mood appears not anxious.    ED Course  Procedures (including critical care time)  Medications  aspirin tablet 325 mg (325 mg Oral Given 10/09/12 1708)  metoCLOPramide (REGLAN) injection 10 mg (10 mg Intramuscular Given 10/09/12 1708)  diphenhydrAMINE (BENADRYL) injection 50 mg (50 mg Intramuscular Given 10/09/12 1708)  ibuprofen (ADVIL,MOTRIN) tablet 800 mg (800 mg Oral Given 10/09/12 1913)   Headache better after meds, no more chest pain. Discussed he has a viral illness.   Pt ran out of his inhaler, will give a prescription for albuterol. Pt is not wheezing tonight.    Results for orders placed during the hospital encounter of 10/09/12  RAPID STREP SCREEN      Component Value Range   Streptococcus, Group A Screen (Direct) NEGATIVE  NEGATIVE  CBC WITH DIFFERENTIAL      Component Value Range   WBC 7.4  4.0 - 10.5 K/uL   RBC 5.47  4.22 - 5.81 MIL/uL   Hemoglobin 15.3  13.0 - 17.0 g/dL   HCT 14.7  82.9 - 56.2 %   MCV 80.4  78.0 - 100.0 fL   MCH 28.0  26.0 - 34.0 pg   MCHC 34.8  30.0 - 36.0 g/dL   RDW 13.0  86.5 - 78.4 %   Platelets 236  150 - 400 K/uL   Neutrophils Relative 55   43 - 77 %   Neutro Abs 4.1  1.7 - 7.7 K/uL   Lymphocytes Relative 34  12 - 46 %   Lymphs  Abs 2.5  0.7 - 4.0 K/uL   Monocytes Relative 8  3 - 12 %   Monocytes Absolute 0.6  0.1 - 1.0 K/uL   Eosinophils Relative 2  0 - 5 %   Eosinophils Absolute 0.2  0.0 - 0.7 K/uL   Basophils Relative 1  0 - 1 %   Basophils Absolute 0.0  0.0 - 0.1 K/uL  COMPREHENSIVE METABOLIC PANEL      Component Value Range   Sodium 139  135 - 145 mEq/L   Potassium 3.7  3.5 - 5.1 mEq/L   Chloride 104  96 - 112 mEq/L   CO2 24  19 - 32 mEq/L   Glucose, Bld 94  70 - 99 mg/dL   BUN 17  6 - 23 mg/dL   Creatinine, Ser 1.61  0.50 - 1.35 mg/dL   Calcium 9.6  8.4 - 09.6 mg/dL   Total Protein 7.0  6.0 - 8.3 g/dL   Albumin 4.0  3.5 - 5.2 g/dL   AST 24  0 - 37 U/L   ALT 24  0 - 53 U/L   Alkaline Phosphatase 86  39 - 117 U/L   Total Bilirubin 0.8  0.3 - 1.2 mg/dL   GFR calc non Af Amer 83 (*) >90 mL/min   GFR calc Af Amer >90  >90 mL/min  LIPASE, BLOOD      Component Value Range   Lipase 38  11 - 59 U/L  TROPONIN I      Component Value Range   Troponin I <0.30  <0.30 ng/mL   Laboratory interpretation all normal   Dg Chest 2 View  10/09/2012  *RADIOLOGY REPORT*  Clinical Data: Cough, congestion, sore throat  CHEST - 2 VIEW  Comparison: 01/27/2012; 07/11/2011; chest CT - 01/27/2012  Findings: Examination degraded secondary to patient body habitus. Grossly unchanged cardiac silhouette and mediastinal contours. Lung volumes remain reduced.  No focal airspace opacities.  No pleural effusion or pneumothorax.  Grossly unchanged bones.  IMPRESSION: Persistently reduced lung volumes without definite acute cardiopulmonary disease.   Original Report Authenticated By: Tacey Ruiz, MD      Date: 10/09/2012  Rate: 71  Rhythm: normal sinus rhythm  QRS Axis: normal  Intervals: normal  ST/T Wave abnormalities: nonspecific T wave changes  Conduction Disutrbances:IRBBB  Narrative Interpretation:   Old EKG Reviewed: unchanged from  01/27/2012    1. Viral illness   2. Laryngitis   3. Headache   4. Chest pain   5. Pharyngitis     New Prescriptions   ALBUTEROL (PROVENTIL HFA;VENTOLIN HFA) 108 (90 BASE) MCG/ACT INHALER    Inhale 2 puffs into the lungs every 4 (four) hours as needed for wheezing or shortness of breath.    Plan discharge  Devoria Albe, MD, FACEP    MDM          Ward Givens, MD 10/09/12 [phone]

## 2012-10-09 NOTE — ED Notes (Signed)
Patient with no complaints at this time. Respirations even and unlabored. Skin warm/dry. Discharge instructions reviewed with patient at this time. Patient given opportunity to voice concerns/ask questions. Patient discharged at this time and left Emergency Department with steady gait.   

## 2012-10-09 NOTE — ED Notes (Addendum)
L ear canal has some erythema w/out exudate and pt. C/o mild pain.  Tonsils erythematous, edematous w/out exudate.

## 2012-11-15 ENCOUNTER — Encounter (HOSPITAL_COMMUNITY): Payer: Self-pay

## 2012-11-15 ENCOUNTER — Emergency Department (HOSPITAL_COMMUNITY)
Admission: EM | Admit: 2012-11-15 | Discharge: 2012-11-15 | Disposition: A | Discharge status: Home / Self Care | Payer: 59 | Attending: Emergency Medicine | Admitting: Emergency Medicine

## 2012-11-15 DIAGNOSIS — Z862 Personal history of diseases of the blood and blood-forming organs and certain disorders involving the immune mechanism: Secondary | ICD-10-CM | POA: Insufficient documentation

## 2012-11-15 DIAGNOSIS — J029 Acute pharyngitis, unspecified: Secondary | ICD-10-CM

## 2012-11-15 DIAGNOSIS — Z79899 Other long term (current) drug therapy: Secondary | ICD-10-CM | POA: Insufficient documentation

## 2012-11-15 DIAGNOSIS — Z8639 Personal history of other endocrine, nutritional and metabolic disease: Secondary | ICD-10-CM | POA: Insufficient documentation

## 2012-11-15 DIAGNOSIS — I10 Essential (primary) hypertension: Secondary | ICD-10-CM | POA: Insufficient documentation

## 2012-11-15 DIAGNOSIS — Z87891 Personal history of nicotine dependence: Secondary | ICD-10-CM | POA: Insufficient documentation

## 2012-11-15 NOTE — ED Notes (Signed)
Pt reports pain to the left side of this throat that radiates up into his left eye.  Pt describes the pain has constant and sharp.  Pt reports having a fever last night.

## 2012-11-15 NOTE — ED Provider Notes (Signed)
History     CSN: [id number]  Arrival date & time 11/15/12  1478   First MD Initiated Contact with Patient 11/15/12 [phone]      Chief Complaint  Patient presents with  . Sore Throat    (Consider location/radiation/quality/duration/timing/severity/associated sxs/prior treatment) HPI Comments: "feels like when i've had the gout before".  Patient is a 46 y.o. male presenting with pharyngitis. The history is provided by the patient. No language interpreter was used.  Sore Throat This is a new problem. Episode onset: 2 days ago. The problem occurs constantly. Associated symptoms include a sore throat. Pertinent negatives include no chills, fever, headaches or myalgias. The symptoms are aggravated by swallowing. He has tried nothing for the symptoms.    Past Medical History  Diagnosis Date  . Hypertension   . Gout   . Gout     History reviewed. No pertinent past surgical history.  Family History  Problem Relation Age of Onset  . Hypertension Mother   . Stroke Father   . Stroke Brother   . Osteoarthritis Brother     History  Substance Use Topics  . Smoking status: Former Smoker    Types: Cigars  . Smokeless tobacco: Not on file  . Alcohol Use: No      Review of Systems  Constitutional: Negative for fever and chills.  HENT: Positive for sore throat.   Respiratory: Negative for shortness of breath, wheezing and stridor.   Musculoskeletal: Negative for myalgias.  Neurological: Negative for headaches.  All other systems reviewed and are negative.    Allergies  Review of patient's allergies indicates no known allergies.  Home Medications   Current Outpatient Rx  Name  Route  Sig  Dispense  Refill  . OMEGA-3 FATTY ACIDS 1000 MG PO CAPS   Oral   Take 1 g by mouth daily.         Marland Kitchen GINKOBA PO   Oral   Take 2 tablets by mouth daily.         Marland Kitchen HYPROMELLOSE 2.5 % OP SOLN   Both Eyes   Place 1 drop into both eyes daily as needed. Dry Eyes         .  IBUPROFEN 200 MG PO TABS   Oral   Take 800 mg by mouth every 6 (six) hours as needed. Pain         . LISINOPRIL 20 MG PO TABS   Oral   Take 10 mg by mouth daily.         . ONE-A-DAY MENS PO   Oral   Take 1 tablet by mouth daily.           . ALBUTEROL SULFATE HFA 108 (90 BASE) MCG/ACT IN AERS   Inhalation   Inhale 2 puffs into the lungs every 4 (four) hours as needed for wheezing or shortness of breath.   1 Inhaler   0     BP 177/100  Pulse 75  Temp 98.7 F (37.1 C) (Oral)  Resp 16  Ht 6\' 2"  (1.88 m)  Wt 295 lb (133.811 kg)  BMI 37.88 kg/m2  SpO2 97%  Physical Exam  Nursing note and vitals reviewed. Constitutional: He is oriented to person, place, and time. He appears well-developed and well-nourished.  HENT:  Head: Normocephalic and atraumatic.  Mouth/Throat: Uvula is midline and mucous membranes are normal. No uvula swelling. Posterior oropharyngeal erythema present. No oropharyngeal exudate, posterior oropharyngeal edema or tonsillar abscesses.  Eyes: EOM are normal.  Neck: Normal range of motion.  Cardiovascular: Normal rate, regular rhythm and intact distal pulses.   Pulmonary/Chest: Effort normal. No respiratory distress.  Abdominal: Soft. He exhibits no distension. There is no tenderness.  Musculoskeletal: Normal range of motion.  Lymphadenopathy:    He has cervical adenopathy.       Right cervical: No superficial cervical and no deep cervical adenopathy present.      Left cervical: Superficial cervical adenopathy present. No deep cervical adenopathy present.  Neurological: He is alert and oriented to person, place, and time.  Skin: Skin is warm and dry.  Psychiatric: He has a normal mood and affect. Judgment normal.    ED Course  Procedures (including critical care time)   Labs Reviewed  RAPID STREP SCREEN   No results found.   1. Pharyngitis       MDM  Strep negative F/u with PCP        Evalina Field, PA 11/15/12 1006

## 2012-11-16 NOTE — ED Provider Notes (Signed)
Medical screening examination/treatment/procedure(s) were performed by non-physician practitioner and as supervising physician I was immediately available for consultation/collaboration.   Laray Anger, DO 11/16/12 Harrietta Guardian

## 2012-11-30 ENCOUNTER — Emergency Department (HOSPITAL_COMMUNITY)
Admission: EM | Admit: 2012-11-30 | Discharge: 2012-11-30 | Disposition: A | Discharge status: Home / Self Care | Payer: 59 | Attending: Emergency Medicine | Admitting: Emergency Medicine

## 2012-11-30 ENCOUNTER — Emergency Department (HOSPITAL_COMMUNITY): Payer: 59

## 2012-11-30 ENCOUNTER — Encounter (HOSPITAL_COMMUNITY): Payer: Self-pay | Admitting: *Deleted

## 2012-11-30 DIAGNOSIS — I10 Essential (primary) hypertension: Secondary | ICD-10-CM

## 2012-11-30 DIAGNOSIS — M109 Gout, unspecified: Secondary | ICD-10-CM | POA: Insufficient documentation

## 2012-11-30 DIAGNOSIS — Z79899 Other long term (current) drug therapy: Secondary | ICD-10-CM | POA: Insufficient documentation

## 2012-11-30 DIAGNOSIS — Z87891 Personal history of nicotine dependence: Secondary | ICD-10-CM | POA: Insufficient documentation

## 2012-11-30 LAB — POCT I-STAT, CHEM 8
BUN: 14 mg/dL (ref 6–23)
Chloride: 108 mEq/L (ref 96–112)
Creatinine, Ser: 1 mg/dL (ref 0.50–1.35)
Sodium: 141 mEq/L (ref 135–145)
TCO2: 23 mmol/L (ref 0–100)

## 2012-11-30 MED ORDER — IBUPROFEN 800 MG PO TABS
800.0000 mg | ORAL_TABLET | Freq: Once | ORAL | Status: AC
  Administered 2012-11-30: 800 mg via ORAL
  Filled 2012-11-30: qty 1

## 2012-11-30 MED ORDER — LISINOPRIL 10 MG PO TABS
20.0000 mg | ORAL_TABLET | Freq: Once | ORAL | Status: AC
  Administered 2012-11-30: 20 mg via ORAL
  Filled 2012-11-30: qty 2

## 2012-11-30 MED ORDER — OXYCODONE-ACETAMINOPHEN 5-325 MG PO TABS: 2.0000 | ORAL_TABLET | ORAL | Status: DC | PRN

## 2012-11-30 MED ORDER — IBUPROFEN 800 MG PO TABS: 800.0000 mg | ORAL_TABLET | Freq: Three times a day (TID) | ORAL | Status: DC

## 2012-11-30 MED ORDER — OXYCODONE-ACETAMINOPHEN 5-325 MG PO TABS
2.0000 | ORAL_TABLET | Freq: Once | ORAL | Status: AC
  Administered 2012-11-30: 2 via ORAL
  Filled 2012-11-30: qty 2

## 2012-11-30 MED ORDER — LISINOPRIL 20 MG PO TABS: 20.0000 mg | ORAL_TABLET | Freq: Every day | ORAL | Status: DC

## 2012-11-30 NOTE — ED Provider Notes (Signed)
History  This chart was scribed for Glynn Octave, MD by Shari Heritage, ED Scribe. The patient was seen in room [facility]12/[facility]12. Patient's care was started at 0918.  CSN: [id number]  Arrival date & time 11/30/12  0909   First MD Initiated Contact with Patient 11/30/12 [phone]      Chief Complaint  Patient presents with  . Gout    The history is provided by the patient. No language interpreter was used.    HPI Comments: Cody Harmon is a 46 y.o. male with a history of gout and hypertension who presents to the Emergency Department complaining of moderate, constant, non-radiating left foot pain localized to the great toe with associated numbness, redness and swelling. Patient states that he hasn't had similar pain for a couple of years and states that current pain is similar to past gout pain in left toe. Patient states that he is out of his BP medications (Lisinopril). He also takes simvastatin to manage cholesterol. Patient denies fever, chest pain, headache, visual changes, or any other joint pain. He denies history of diabetes or kidney problems. He hasn't had a recent kidney function test. Patient doesn't have a PCP.    Past Medical History  Diagnosis Date  . Hypertension   . Gout   . Gout     History reviewed. No pertinent past surgical history.  Family History  Problem Relation Age of Onset  . Hypertension Mother   . Stroke Father   . Stroke Brother   . Osteoarthritis Brother     History  Substance Use Topics  . Smoking status: Former Smoker    Types: Cigars  . Smokeless tobacco: Not on file  . Alcohol Use: No      Review of Systems A complete 10 system review of systems was obtained and all systems are negative except as noted in the HPI and PMH.   Allergies  Review of patient's allergies indicates no known allergies.  Home Medications   Current Outpatient Rx  Name  Route  Sig  Dispense  Refill  . ALBUTEROL SULFATE HFA 108 (90 BASE) MCG/ACT IN AERS  Inhalation   Inhale 2 puffs into the lungs every 4 (four) hours as needed for wheezing or shortness of breath.   1 Inhaler   0   . OMEGA-3 FATTY ACIDS 1000 MG PO CAPS   Oral   Take 1 g by mouth daily.         Marland Kitchen GINKOBA PO   Oral   Take 2 tablets by mouth daily.         . IBUPROFEN 200 MG PO TABS   Oral   Take 800 mg by mouth every 6 (six) hours as needed. Pain         . ONE-A-DAY MENS PO   Oral   Take 1 tablet by mouth daily.             Triage Vitals: BP 167/110  Pulse 88  Temp 98.3 F (36.8 C) (Oral)  Resp 16  Ht 6\' 2"  (1.88 m)  Wt 285 lb (129.275 kg)  BMI 36.59 kg/m2  SpO2 98%  Physical Exam  Constitutional: He is oriented to person, place, and time. He appears well-developed and well-nourished. No distress.  HENT:  Head: Normocephalic and atraumatic.  Eyes: Conjunctivae normal are normal.  Neck: Neck supple.  Cardiovascular: Normal rate, regular rhythm and normal heart sounds.   No murmur heard. Pulmonary/Chest: Effort normal and breath sounds normal. No respiratory distress.  He has no wheezes. He has no rales.  Abdominal: Soft. Bowel sounds are normal. He exhibits no distension. There is no tenderness. There is no rebound.  Musculoskeletal: Normal range of motion. He exhibits tenderness.       Tenderness over the left first MT joint. No erythema or edema. Full ROM of ankle. +2 DP/PT pulses.  Neurological: He is alert and oriented to person, place, and time.  Skin: Skin is warm and dry.  Psychiatric: He has a normal mood and affect. His behavior is normal.    ED Course  Procedures (including critical care time) DIAGNOSTIC STUDIES: Oxygen Saturation is 98% on room air, normal by my interpretation.    COORDINATION OF CARE: 9:28 AM- Patient informed of current plan for treatment and evaluation and agrees with plan at this time.    Labs Reviewed  POCT I-STAT, CHEM 8 - Abnormal; Notable for the following:    Glucose, Bld 121 (*)     All other  components within normal limits    Dg Foot Complete Left  11/30/2012  *RADIOLOGY REPORT*  Clinical Data: Pain at the base of the toes.  History of gout.  LEFT FOOT - COMPLETE 3+ VIEW  Comparison: 10/31/2004  Findings: The phalanges appear unremarkable.  Scalloping along the lateral bases of the third and fourth metatarsals is similar to prior and may be from mild chronic erosion.  There is mild spurring of the navicular medially, and also dorsal midfoot spurring as well as the plantar and Achilles calcaneal spurring.  IMPRESSION:  1.  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 2.  Calcaneal and midfoot spurring. 3.  No new erosion or erosion along the phalanges is currently observed.   Original Report Authenticated By: Gaylyn Rong, M.D.      No diagnosis found.    MDM  L great toe pain x 12 hours. history of gout and similar feeling. No other joint pain. No fever. Blood pressure medications for 2 weeks but denies any eye pain, headache, vision changes chest pain.  X-ray negative for fracture. Creatinine Acceptable. we'll start NSAIDs and anti-inflammatories for gout. Refill blood pressure medications, establish care with PCP.    I personally performed the services described in this documentation, which was scribed in my presence. The recorded information has been reviewed and is accurate.    Glynn Octave, MD 11/30/12 [phone]

## 2012-11-30 NOTE — ED Notes (Signed)
Gout to left foot that woke pt up this morning with redness and swelling.

## 2012-11-30 NOTE — ED Notes (Signed)
Pt reports waking this am w/ gout to left foot. Denies any known or recent injury. H/o gout.

## 2012-12-19 ENCOUNTER — Encounter (HOSPITAL_COMMUNITY): Payer: Self-pay | Admitting: *Deleted

## 2012-12-19 ENCOUNTER — Emergency Department (HOSPITAL_COMMUNITY): Payer: PRIVATE HEALTH INSURANCE

## 2012-12-19 ENCOUNTER — Emergency Department (HOSPITAL_COMMUNITY)
Admission: EM | Admit: 2012-12-19 | Discharge: 2012-12-19 | Disposition: A | Discharge status: Home / Self Care | Payer: PRIVATE HEALTH INSURANCE | Attending: Emergency Medicine | Admitting: Emergency Medicine

## 2012-12-19 DIAGNOSIS — I10 Essential (primary) hypertension: Secondary | ICD-10-CM | POA: Insufficient documentation

## 2012-12-19 DIAGNOSIS — M109 Gout, unspecified: Secondary | ICD-10-CM | POA: Insufficient documentation

## 2012-12-19 DIAGNOSIS — Z87891 Personal history of nicotine dependence: Secondary | ICD-10-CM | POA: Insufficient documentation

## 2012-12-19 DIAGNOSIS — M25439 Effusion, unspecified wrist: Secondary | ICD-10-CM | POA: Insufficient documentation

## 2012-12-19 DIAGNOSIS — Z79899 Other long term (current) drug therapy: Secondary | ICD-10-CM | POA: Insufficient documentation

## 2012-12-19 DIAGNOSIS — M25539 Pain in unspecified wrist: Secondary | ICD-10-CM | POA: Insufficient documentation

## 2012-12-19 DIAGNOSIS — M2550 Pain in unspecified joint: Secondary | ICD-10-CM | POA: Insufficient documentation

## 2012-12-19 LAB — CBC WITH DIFFERENTIAL/PLATELET
Basophils Absolute: 0 10*3/uL (ref 0.0–0.1)
Eosinophils Relative: 3 % (ref 0–5)
HCT: 43.6 % (ref 39.0–52.0)
Hemoglobin: 15.3 g/dL (ref 13.0–17.0)
Lymphocytes Relative: 21 % (ref 12–46)
Lymphs Abs: 1.4 10*3/uL (ref 0.7–4.0)
MCV: 80.4 fL (ref 78.0–100.0)
Monocytes Absolute: 0.4 10*3/uL (ref 0.1–1.0)
Monocytes Relative: 6 % (ref 3–12)
Neutro Abs: 4.7 10*3/uL (ref 1.7–7.7)
RBC: 5.42 MIL/uL (ref 4.22–5.81)
WBC: 6.8 10*3/uL (ref 4.0–10.5)

## 2012-12-19 LAB — URIC ACID: Uric Acid, Serum: 8.4 mg/dL — ABNORMAL HIGH (ref 4.0–7.8)

## 2012-12-19 MED ORDER — PREDNISONE 50 MG PO TABS
60.0000 mg | ORAL_TABLET | Freq: Once | ORAL | Status: AC
  Administered 2012-12-19: 60 mg via ORAL
  Filled 2012-12-19: qty 1

## 2012-12-19 MED ORDER — PREDNISONE 50 MG PO TABS: ORAL_TABLET | ORAL | Status: DC

## 2012-12-19 MED ORDER — OXYCODONE-ACETAMINOPHEN 5-325 MG PO TABS: 1.0000 | ORAL_TABLET | ORAL | Status: AC | PRN

## 2012-12-19 MED ORDER — OXYCODONE-ACETAMINOPHEN 5-325 MG PO TABS
2.0000 | ORAL_TABLET | Freq: Once | ORAL | Status: AC
  Administered 2012-12-19: 2 via ORAL
  Filled 2012-12-19: qty 2

## 2012-12-19 NOTE — ED Provider Notes (Signed)
History     CSN: [id number]  Arrival date & time 12/19/12  1308   First MD Initiated Contact with Patient 12/19/12 1017      Chief Complaint  Patient presents with  . Gout    (Consider location/radiation/quality/duration/timing/severity/associated sxs/prior treatment) HPI Comments: Patient with a previous history of gout complains of gradually worsening pain to his right wrist since yesterday. He states that he woke this morning with increased pain and swelling to the wrist. He denies known injury. He states pain feels similar to gout symptoms that he's had in the past in his foot. He denies proximal tenderness numbness or tingling to his fingers, redness, or fever. Patient is left-hand dominant. He states he's been taking ibuprofen for the pain with only minimal relief.  Patient is a 47 y.o. male presenting with wrist pain. The history is provided by the patient.  Wrist Pain This is a new problem. The current episode started today. The problem occurs constantly. The problem has been unchanged. Associated symptoms include arthralgias and joint swelling. Pertinent negatives include no chills, fever, headaches, myalgias, nausea, neck pain, numbness, rash, swollen glands or weakness. The symptoms are aggravated by bending and twisting. He has tried nothing for the symptoms. The treatment provided no relief.    Past Medical History  Diagnosis Date  . Hypertension   . Gout   . Gout     History reviewed. No pertinent past surgical history.  Family History  Problem Relation Age of Onset  . Hypertension Mother   . Stroke Father   . Stroke Brother   . Osteoarthritis Brother     History  Substance Use Topics  . Smoking status: Former Smoker    Types: Cigars  . Smokeless tobacco: Not on file  . Alcohol Use: No      Review of Systems  Constitutional: Negative for fever and chills.  HENT: Negative for neck pain.   Gastrointestinal: Negative for nausea.  Genitourinary: Negative  for dysuria and difficulty urinating.  Musculoskeletal: Positive for joint swelling and arthralgias. Negative for myalgias.  Skin: Negative for color change, rash and wound.  Neurological: Negative for weakness, numbness and headaches.  All other systems reviewed and are negative.    Allergies  Review of patient's allergies indicates no known allergies.  Home Medications   Current Outpatient Rx  Name  Route  Sig  Dispense  Refill  . ALBUTEROL SULFATE HFA 108 (90 BASE) MCG/ACT IN AERS   Inhalation   Inhale 2 puffs into the lungs every 4 (four) hours as needed for wheezing or shortness of breath.   1 Inhaler   0   . OMEGA-3 FATTY ACIDS 1000 MG PO CAPS   Oral   Take 1 g by mouth daily.         Marland Kitchen GINKOBA PO   Oral   Take 2 tablets by mouth daily.         . IBUPROFEN 200 MG PO TABS   Oral   Take 800 mg by mouth every 6 (six) hours as needed. Pain         . IBUPROFEN 800 MG PO TABS   Oral   Take 1 tablet (800 mg total) by mouth 3 (three) times daily.   21 tablet   0   . LISINOPRIL 20 MG PO TABS   Oral   Take 1 tablet (20 mg total) by mouth daily.   30 tablet   0   . ONE-A-DAY MENS PO  Oral   Take 1 tablet by mouth daily.           . OXYCODONE-ACETAMINOPHEN 5-325 MG PO TABS   Oral   Take 2 tablets by mouth every 4 (four) hours as needed for pain.   15 tablet   0     BP 163/100  Pulse 75  Temp 98.5 F (36.9 C) (Oral)  Resp 18  Ht 6\' 2"  (1.88 m)  Wt 290 lb (131.543 kg)  BMI 37.23 kg/m2  SpO2 98%  Physical Exam  Nursing note and vitals reviewed. Constitutional: He is oriented to person, place, and time. He appears well-developed and well-nourished. No distress.  HENT:  Head: Normocephalic and atraumatic.  Neck: Normal range of motion. Neck supple. No thyromegaly present.  Cardiovascular: Normal rate, regular rhythm, normal heart sounds and intact distal pulses.   No murmur heard. Pulmonary/Chest: Effort normal and breath sounds normal. No  respiratory distress.  Musculoskeletal: He exhibits edema and tenderness.       Right wrist: He exhibits tenderness and swelling. He exhibits normal range of motion, no bony tenderness, no effusion, no crepitus, no deformity and no laceration.       Arms:      Tenderness to palpation of the dorsal right wrist. Mild to moderate soft tissue swelling.  Radial pulse is brisk, sensation intact.  CR< 2 sec.  No bruising, erythema  or deformity.  Patient has full ROM.  Lymphadenopathy:    He has no cervical adenopathy.  Neurological: He is alert and oriented to person, place, and time. He exhibits normal muscle tone. Coordination normal.  Skin: Skin is warm and dry.    ED Course  Procedures (including critical care time)  Results for orders placed during the hospital encounter of 12/19/12  URIC ACID      Component Value Range   Uric Acid, Serum 8.4 (*) 4.0 - 7.8 mg/dL  CBC WITH DIFFERENTIAL      Component Value Range   WBC 6.8  4.0 - 10.5 K/uL   RBC 5.42  4.22 - 5.81 MIL/uL   Hemoglobin 15.3  13.0 - 17.0 g/dL   HCT 78.2  95.6 - 21.3 %   MCV 80.4  78.0 - 100.0 fL   MCH 28.2  26.0 - 34.0 pg   MCHC 35.1  30.0 - 36.0 g/dL   RDW 08.6  57.8 - 46.9 %   Platelets 211  150 - 400 K/uL   Neutrophils Relative 69  43 - 77 %   Neutro Abs 4.7  1.7 - 7.7 K/uL   Lymphocytes Relative 21  12 - 46 %   Lymphs Abs 1.4  0.7 - 4.0 K/uL   Monocytes Relative 6  3 - 12 %   Monocytes Absolute 0.4  0.1 - 1.0 K/uL   Eosinophils Relative 3  0 - 5 %   Eosinophils Absolute 0.2  0.0 - 0.7 K/uL   Basophils Relative 0  0 - 1 %   Basophils Absolute 0.0  0.0 - 0.1 K/uL    Dg Wrist Complete Right  12/19/2012  *RADIOLOGY REPORT*  Clinical Data: Right wrist pain.  History of gout.  RIGHT WRIST - COMPLETE 3+ VIEW  Comparison: 04/30/2007.  Findings: Stable erosions consistent with known gout.  These involve the trapezium and the second and third metacarpal heads. 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  The  scapholunate joint space is widened.  No acute fractures identified.  IMPRESSION: Stable erosions consistent with known  gout. 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 No acute bony findings.   Original Report Authenticated By: Rudie Meyer, M.D.      MDM    Pt with hx of gout and likely recurrent gout to the right wrist.  Doubt septic joint.  Pt agrees to elevate and close f/u with Dr. Hilda Lias.    Prescribed: Prednisone Percocet #20      Alyssha Housh L. Turpin, [place] 12/20/12 2021

## 2012-12-19 NOTE — ED Notes (Signed)
Pain, swelling to right wrist upon waking this morning.

## 2012-12-22 NOTE — ED Provider Notes (Signed)
Medical screening examination/treatment/procedure(s) were performed by non-physician practitioner and as supervising physician I was immediately available for consultation/collaboration.   Joya Gaskins, MD 12/22/12 1800

## 2013-02-13 ENCOUNTER — Encounter (HOSPITAL_COMMUNITY): Payer: Self-pay | Admitting: Emergency Medicine

## 2013-02-13 ENCOUNTER — Emergency Department (HOSPITAL_COMMUNITY)
Admission: EM | Admit: 2013-02-13 | Discharge: 2013-02-13 | Disposition: A | Discharge status: Home / Self Care | Payer: PRIVATE HEALTH INSURANCE | Attending: Emergency Medicine | Admitting: Emergency Medicine

## 2013-02-13 DIAGNOSIS — Z79899 Other long term (current) drug therapy: Secondary | ICD-10-CM | POA: Insufficient documentation

## 2013-02-13 DIAGNOSIS — M109 Gout, unspecified: Secondary | ICD-10-CM

## 2013-02-13 DIAGNOSIS — Z87891 Personal history of nicotine dependence: Secondary | ICD-10-CM | POA: Insufficient documentation

## 2013-02-13 DIAGNOSIS — I10 Essential (primary) hypertension: Secondary | ICD-10-CM | POA: Insufficient documentation

## 2013-02-13 MED ORDER — OXYCODONE-ACETAMINOPHEN 5-325 MG PO TABS
1.0000 | ORAL_TABLET | Freq: Once | ORAL | Status: AC
  Administered 2013-02-13: 1 via ORAL
  Filled 2013-02-13: qty 1

## 2013-02-13 MED ORDER — PREDNISONE 10 MG PO TABS: ORAL_TABLET | ORAL | Status: DC

## 2013-02-13 MED ORDER — PREDNISONE 50 MG PO TABS
60.0000 mg | ORAL_TABLET | Freq: Once | ORAL | Status: AC
  Administered 2013-02-13: 60 mg via ORAL
  Filled 2013-02-13: qty 1

## 2013-02-13 MED ORDER — OXYCODONE-ACETAMINOPHEN 5-325 MG PO TABS: 1.0000 | ORAL_TABLET | ORAL | Status: DC | PRN

## 2013-02-13 NOTE — ED Notes (Signed)
Pt c/o gout pain and swelling in left foot.

## 2013-02-16 NOTE — ED Provider Notes (Signed)
Medical screening examination/treatment/procedure(s) were performed by non-physician practitioner and as supervising physician I was immediately available for consultation/collaboration.  Raeford Razor, MD 02/16/13 1450

## 2013-02-16 NOTE — ED Provider Notes (Signed)
History     CSN: [id number]  Arrival date & time 02/13/13  1131   First MD Initiated Contact with Patient 02/13/13 1257      Chief Complaint  Patient presents with  . Gout    (Consider location/radiation/quality/duration/timing/severity/associated sxs/prior treatment) HPI Comments: Cody Harmon is a 47 y.o. Male with a prior history of gout with complaints of pain and swelling in his left foot which started overnight while sleeping. His last episode occurred just 2 months ago in his right wrist,  But generally gout affects his feet.  He denies injury and states his pain feels similar to previous gout attacks.  He has been trying to maintain a "gout healthy" diet, and it is unusual for him to 2 attacks so close together stating is has been several years since his last episode occurred.  He has tried ibuprofen without relief of pain.     The history is provided by the patient.    Past Medical History  Diagnosis Date  . Hypertension   . Gout   . Gout     History reviewed. No pertinent past surgical history.  Family History  Problem Relation Age of Onset  . Hypertension Mother   . Stroke Father   . Stroke Brother   . Osteoarthritis Brother     History  Substance Use Topics  . Smoking status: Former Smoker    Types: Cigars  . Smokeless tobacco: Not on file  . Alcohol Use: No      Review of Systems  Constitutional: Negative for fever and chills.  Musculoskeletal: Positive for joint swelling and arthralgias. Negative for myalgias.  Skin: Negative for color change.  Neurological: Negative for weakness and numbness.    Allergies  Review of patient's allergies indicates no known allergies.  Home Medications   Current Outpatient Rx  Name  Route  Sig  Dispense  Refill  . albuterol (PROVENTIL HFA;VENTOLIN HFA) 108 (90 BASE) MCG/ACT inhaler   Inhalation   Inhale 2 puffs into the lungs every 4 (four) hours as needed for wheezing or shortness of breath.   1  Inhaler   0   . fish oil-omega-3 fatty acids 1000 MG capsule   Oral   Take 1 g by mouth daily.         . Ginkgo Biloba (GINKOBA PO)   Oral   Take 2 tablets by mouth daily.         Marland Kitchen ibuprofen (ADVIL,MOTRIN) 200 MG tablet   Oral   Take 800 mg by mouth every 6 (six) hours as needed. Pain         . lisinopril (PRINIVIL,ZESTRIL) 20 MG tablet   Oral   Take 1 tablet (20 mg total) by mouth daily.   30 tablet   0   . Multiple Vitamin (ONE-A-DAY MENS PO)   Oral   Take 1 tablet by mouth daily.           Marland Kitchen oxyCODONE-acetaminophen (PERCOCET/ROXICET) 5-325 MG per tablet   Oral   Take 2 tablets by mouth every 4 (four) hours as needed for pain.   15 tablet   0   . oxyCODONE-acetaminophen (PERCOCET/ROXICET) 5-325 MG per tablet   Oral   Take 1 tablet by mouth every 4 (four) hours as needed for pain.   20 tablet   0   . predniSONE (DELTASONE) 10 MG tablet      6, 5, 4, 3, 2 then 1 tablet by mouth daily for 6 days  total.   21 tablet   0   . predniSONE (DELTASONE) 50 MG tablet      One tab po QD x 4 days   4 tablet   0   . sodium chloride (OCEAN) 0.65 % nasal spray   Nasal   Place 1 spray into the nose as needed. Nasal congestion.           BP 147/97  Pulse 65  Temp(Src) 98.4 F (36.9 C) (Oral)  Resp 18  Ht 6\' 2"  (1.88 m)  Wt 290 lb (131.543 kg)  BMI 37.22 kg/m2  SpO2 100%  Physical Exam  Constitutional: He appears well-developed and well-nourished.  HENT:  Head: Atraumatic.  Neck: Normal range of motion.  Cardiovascular:  Pulses equal bilaterally  Musculoskeletal: He exhibits edema and tenderness.  TTP along left medial foot at lateral and planter surfaces at level of 1st mtp joint.  Less than 3 sec cap refill.  Dorsalis pedis pulses equal and full bilaterally.  No red streaking,  No skin lesions or wounds.  Neurological: He is alert. He has normal strength. He displays normal reflexes. No sensory deficit.  Equal strength  Skin: Skin is warm and dry.   Psychiatric: He has a normal mood and affect.    ED Course  Procedures (including critical care time)  Labs Reviewed - No data to display No results found.   1. Gout flare       MDM  Pt prescribed prednisone 6 day taper,  Oxycodone.  Encouraged warm compresses, elevation and f/u by pcp if sx persist.  No risk factors for infected joint, doubt this as the source of sx.  Exam most c/w gout.        Burgess Amor, PA-C 02/16/13 1257

## 2013-02-24 IMAGING — CR DG FOOT COMPLETE 3+V*R*
3 series · 3 of 3 positions shown · non-contrast
Comparison: None.

CLINICAL DATA: The MCP joint pain over the past 2-3 days.  No known
injuries.  History of gout.

RIGHT FOOT COMPLETE - 3+ VIEW 04/21/2011:

[view not recorded (1 of 3)]
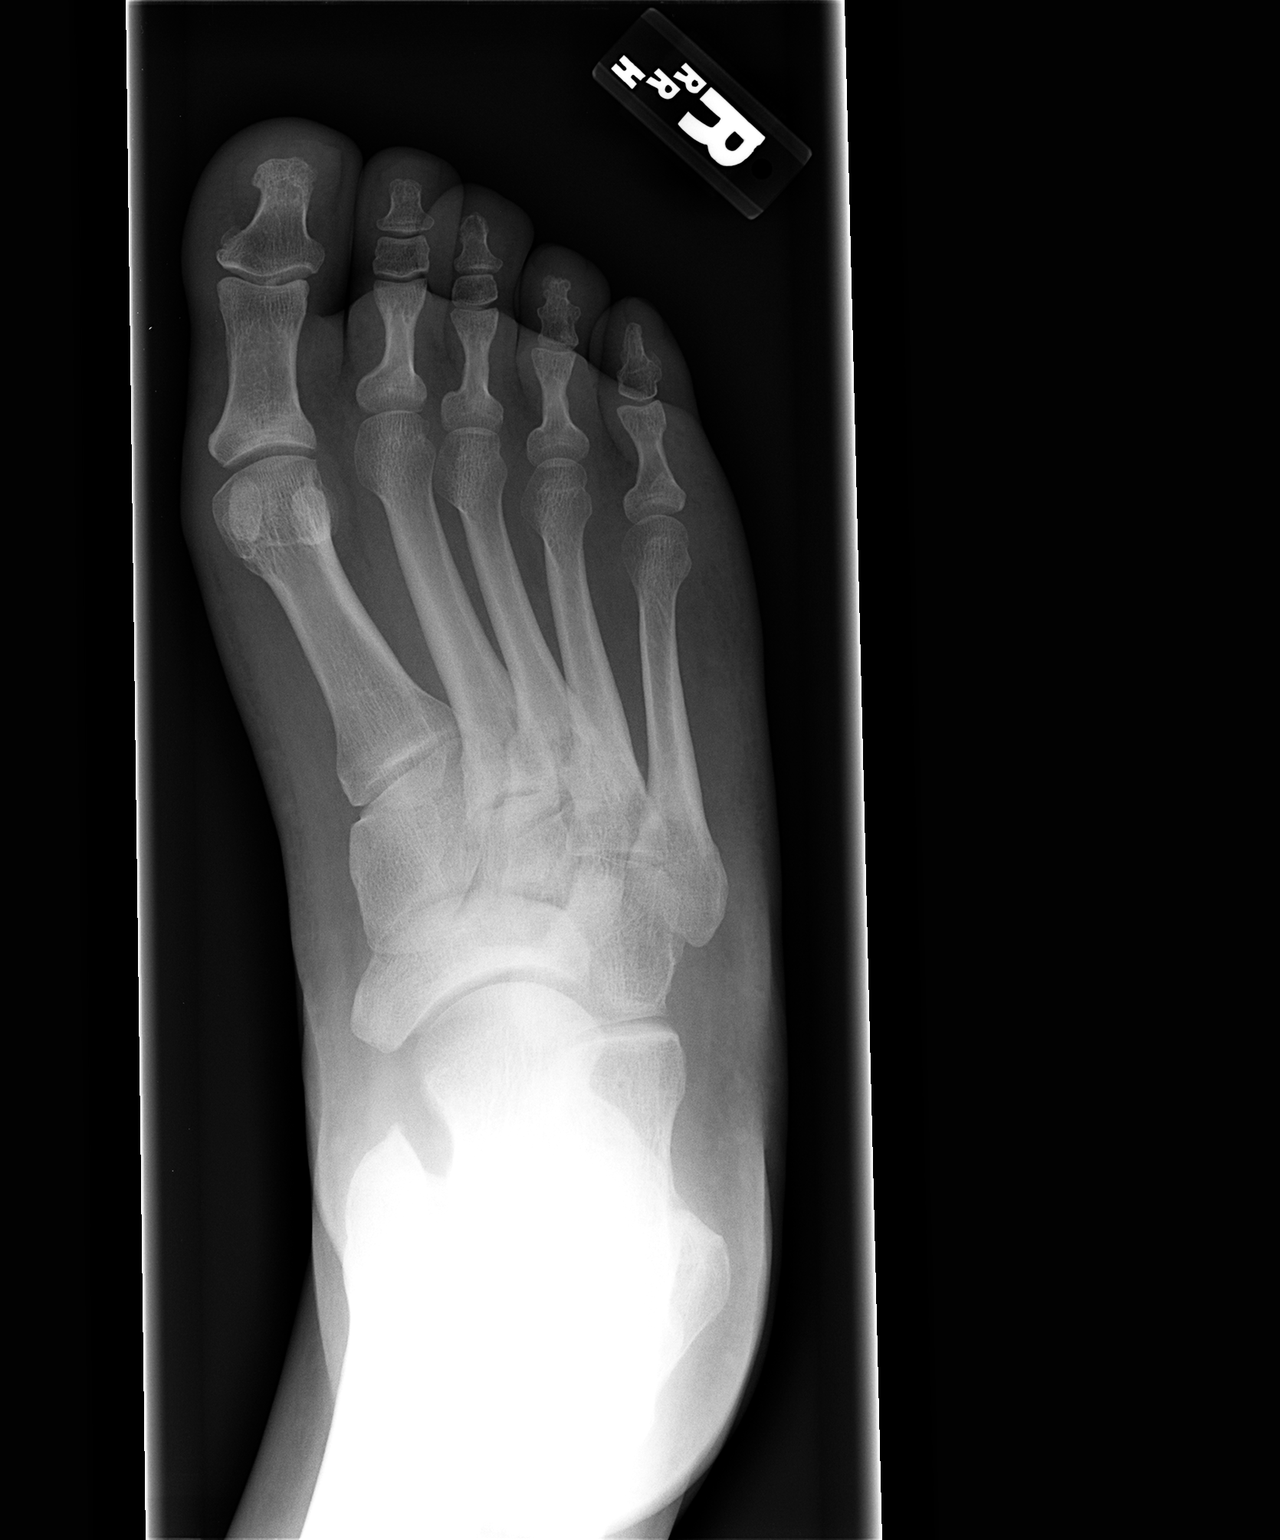

[view not recorded (2 of 3)]
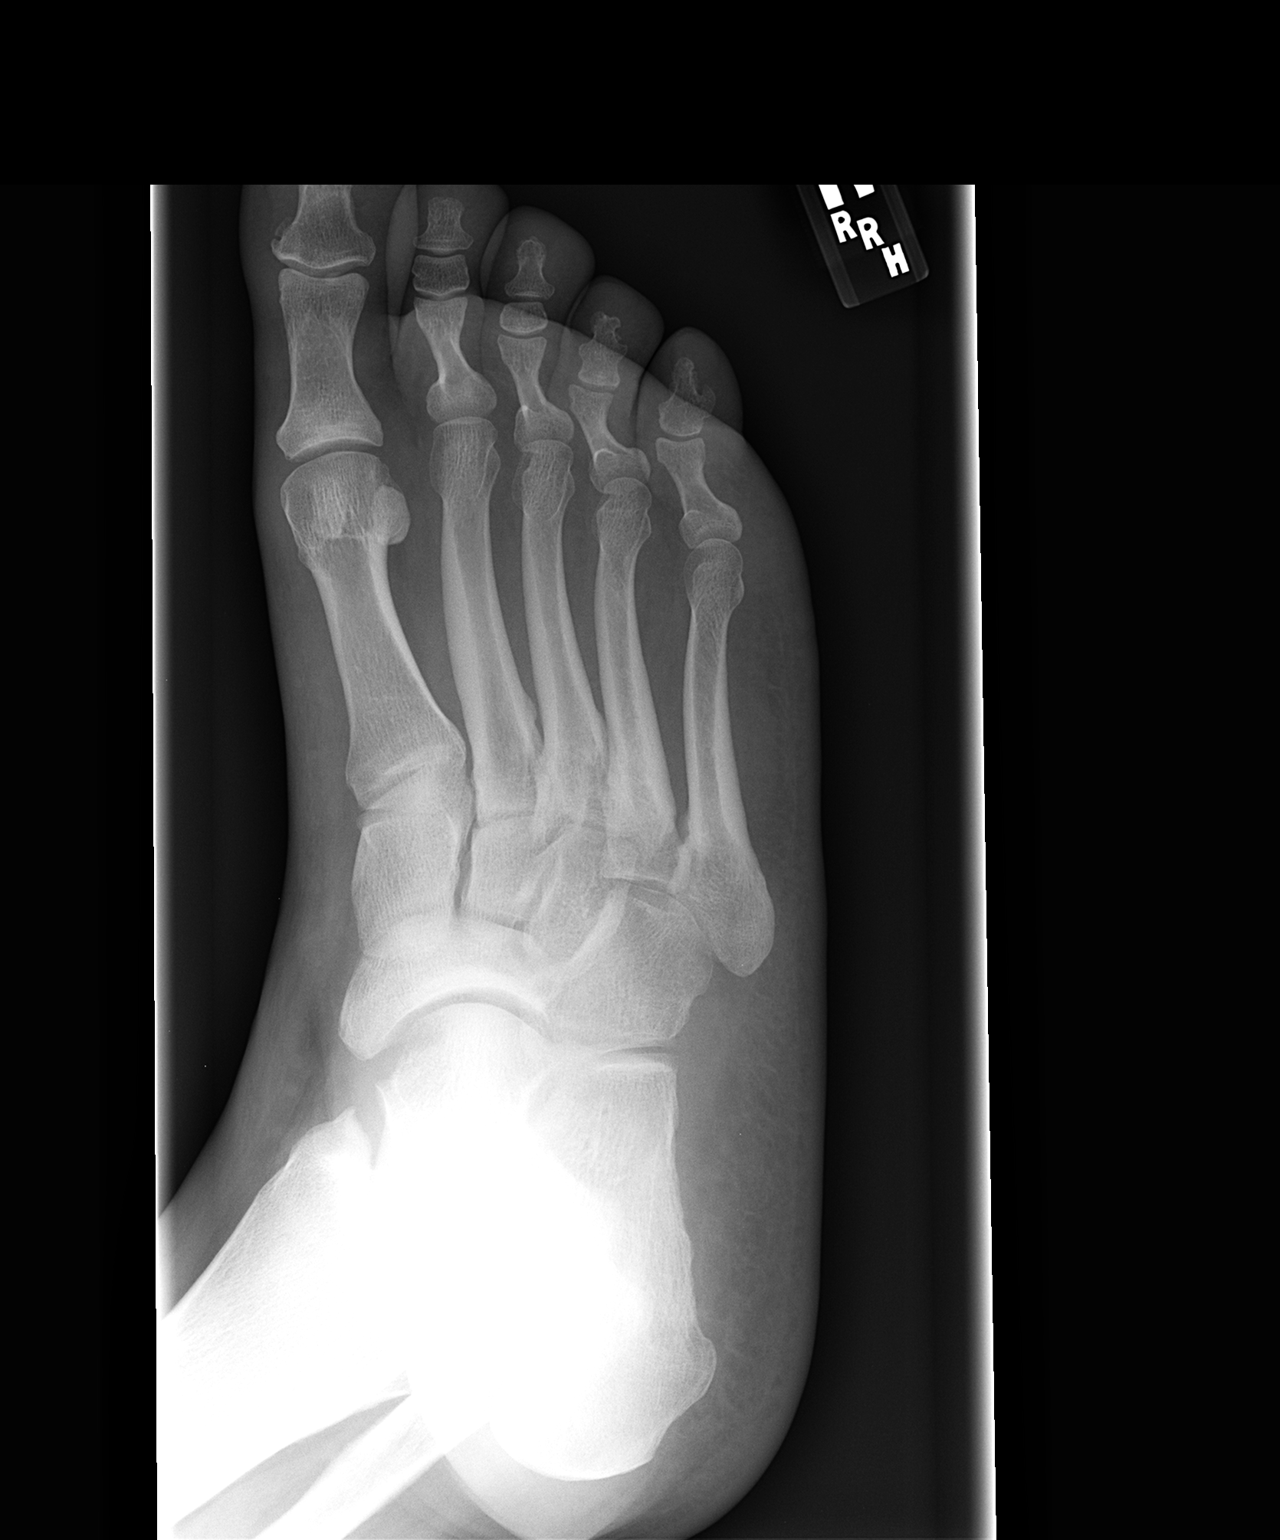

[view not recorded (3 of 3)]
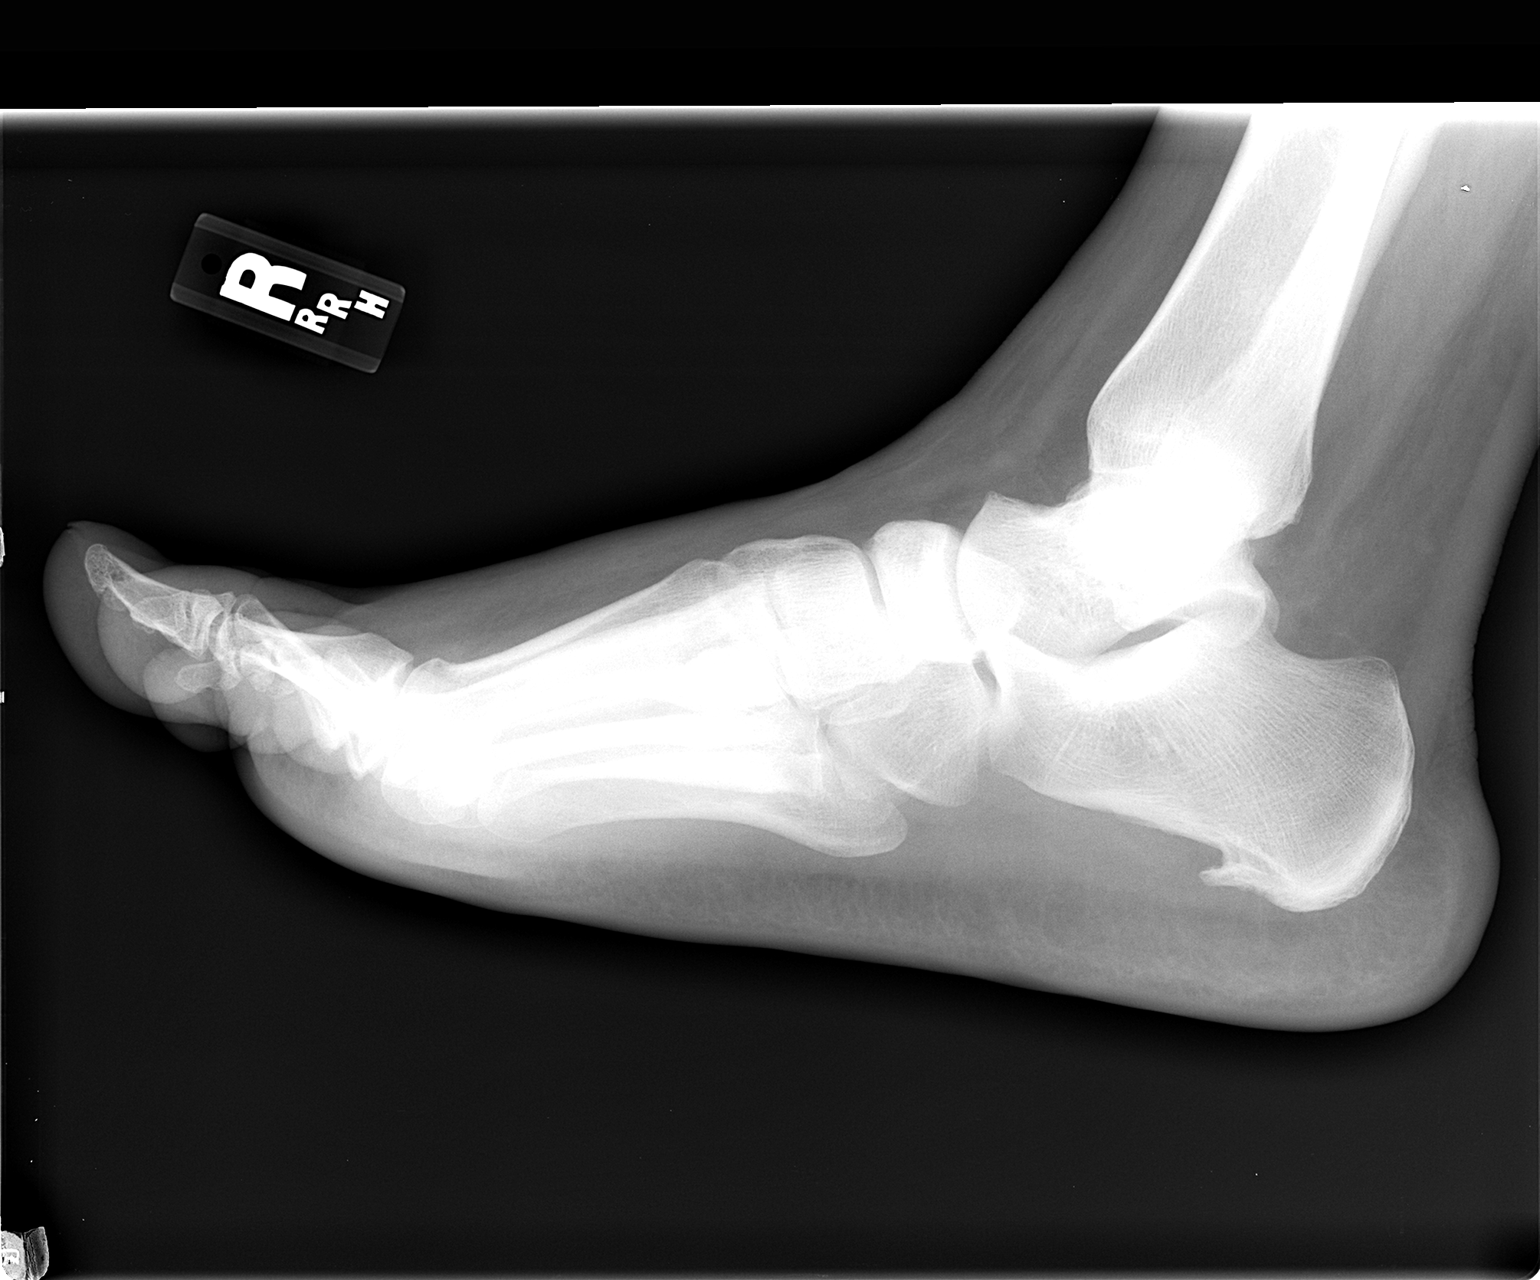

[3 of 3 positions shown; findings below may reference images not displayed]

FINDINGS: No evidence of acute or subacute fracture or dislocation.
Well-preserved joint spaces.  Well-preserved bone mineral density.
No intrinsic osseous abnormalities.  No erosions identified to
confirm gout.
IMPRESSION: Normal examination.

## 2013-04-03 ENCOUNTER — Emergency Department (HOSPITAL_COMMUNITY): Payer: PRIVATE HEALTH INSURANCE

## 2013-04-03 ENCOUNTER — Emergency Department (HOSPITAL_COMMUNITY)
Admission: EM | Admit: 2013-04-03 | Discharge: 2013-04-03 | Disposition: A | Discharge status: Home / Self Care | Payer: PRIVATE HEALTH INSURANCE | Attending: Emergency Medicine | Admitting: Emergency Medicine

## 2013-04-03 ENCOUNTER — Encounter (HOSPITAL_COMMUNITY): Payer: Self-pay

## 2013-04-03 DIAGNOSIS — J309 Allergic rhinitis, unspecified: Secondary | ICD-10-CM | POA: Insufficient documentation

## 2013-04-03 DIAGNOSIS — IMO0002 Reserved for concepts with insufficient information to code with codable children: Secondary | ICD-10-CM | POA: Insufficient documentation

## 2013-04-03 DIAGNOSIS — Z8739 Personal history of other diseases of the musculoskeletal system and connective tissue: Secondary | ICD-10-CM | POA: Insufficient documentation

## 2013-04-03 DIAGNOSIS — I10 Essential (primary) hypertension: Secondary | ICD-10-CM | POA: Insufficient documentation

## 2013-04-03 DIAGNOSIS — Z87891 Personal history of nicotine dependence: Secondary | ICD-10-CM | POA: Insufficient documentation

## 2013-04-03 DIAGNOSIS — J3489 Other specified disorders of nose and nasal sinuses: Secondary | ICD-10-CM | POA: Insufficient documentation

## 2013-04-03 DIAGNOSIS — H579 Unspecified disorder of eye and adnexa: Secondary | ICD-10-CM | POA: Insufficient documentation

## 2013-04-03 DIAGNOSIS — M1710 Unilateral primary osteoarthritis, unspecified knee: Secondary | ICD-10-CM | POA: Insufficient documentation

## 2013-04-03 DIAGNOSIS — J302 Other seasonal allergic rhinitis: Secondary | ICD-10-CM

## 2013-04-03 DIAGNOSIS — R51 Headache: Secondary | ICD-10-CM | POA: Insufficient documentation

## 2013-04-03 DIAGNOSIS — M1712 Unilateral primary osteoarthritis, left knee: Secondary | ICD-10-CM

## 2013-04-03 MED ORDER — CETIRIZINE HCL 10 MG PO CAPS: 10.0000 mg | ORAL_CAPSULE | Freq: Every day | ORAL | Status: DC

## 2013-04-03 MED ORDER — OXYCODONE-ACETAMINOPHEN 5-325 MG PO TABS: 1.0000 | ORAL_TABLET | ORAL | Status: DC | PRN

## 2013-04-03 MED ORDER — OXYCODONE-ACETAMINOPHEN 5-325 MG PO TABS
1.0000 | ORAL_TABLET | Freq: Once | ORAL | Status: AC
  Administered 2013-04-03: 1 via ORAL
  Filled 2013-04-03: qty 1

## 2013-04-03 MED ORDER — HYDROCODONE-ACETAMINOPHEN 5-325 MG PO TABS
1.0000 | ORAL_TABLET | Freq: Once | ORAL | Status: AC
  Administered 2013-04-03: 1 via ORAL
  Filled 2013-04-03: qty 1

## 2013-04-03 NOTE — ED Notes (Signed)
Pt c/o pain in left knee since Friday and c/o headache since last night.

## 2013-04-05 NOTE — ED Provider Notes (Signed)
History     CSN: [id number]  Arrival date & time 04/03/13  1150   First MD Initiated Contact with Patient 04/03/13 1158      Chief Complaint  Patient presents with  . Knee Pain  . Headache    (Consider location/radiation/quality/duration/timing/severity/associated sxs/prior treatment) HPI Comments: Cody Harmon is a 47 y.o. Male with 2 complaints,  The first being left knee pain  For the past 2 days.  He has denies injury to the left knee but reports he has been using a treadmill and has increased pain since a workout 2 days ago.  He does have a history of gout and states the pain is similar but he denies swelling at the site.  He describes a "grinding" sensation in his knee. Pain worse with walking and standing,  Improves with rest.  He has taken ibuprofen without relief of pain. Additionally,  He reports having a frontal headache along with itchy eyes and nasal congestion with clear nasal drainage since running out of a [employer] "allergy pill" several days ago.  He denies fever, chills, ear or throat pain, no decreased hearing acuity, dizziness, cough or shortness of breath.     The history is provided by the patient.    Past Medical History  Diagnosis Date  . Hypertension   . Gout   . Gout     History reviewed. No pertinent past surgical history.  Family History  Problem Relation Age of Onset  . Hypertension Mother   . Stroke Father   . Stroke Brother   . Osteoarthritis Brother     History  Substance Use Topics  . Smoking status: Former Smoker    Types: Cigars  . Smokeless tobacco: Not on file  . Alcohol Use: No      Review of Systems  Constitutional: Negative for fever and chills.  HENT: Positive for congestion and rhinorrhea. Negative for sore throat, facial swelling and neck pain.   Respiratory: Negative for cough, shortness of breath and wheezing.   Musculoskeletal: Positive for arthralgias. Negative for myalgias and joint swelling.  Skin:  Negative for color change.  Neurological: Positive for headaches. Negative for weakness and numbness.    Allergies  Review of patient's allergies indicates no known allergies.  Home Medications   Current Outpatient Rx  Name  Route  Sig  Dispense  Refill  . fish oil-omega-3 fatty acids 1000 MG capsule   Oral   Take 1 g by mouth daily.         Marland Kitchen ibuprofen (ADVIL,MOTRIN) 200 MG tablet   Oral   Take 800 mg by mouth every 6 (six) hours as needed. Pain         . Multiple Vitamin (ONE-A-DAY MENS PO)   Oral   Take 1 tablet by mouth daily.           Marland Kitchen oxyCODONE-acetaminophen (PERCOCET/ROXICET) 5-325 MG per tablet   Oral   Take 1 tablet by mouth every 4 (four) hours as needed for pain.   20 tablet   0     BP 166/99  Pulse 66  Temp(Src) 97.8 F (36.6 C) (Oral)  Resp 18  Ht 6\' 2"  (1.88 m)  Wt 290 lb (131.543 kg)  BMI 37.22 kg/m2  SpO2 100%  Physical Exam  Constitutional: He is oriented to person, place, and time. He appears well-developed and well-nourished.  HENT:  Head: Normocephalic and atraumatic.  Right Ear: Tympanic membrane and ear canal normal.  Left Ear: Tympanic  membrane and ear canal normal.  Nose: Mucosal edema and rhinorrhea present.  Mouth/Throat: Uvula is midline, oropharynx is clear and moist and mucous membranes are normal. No oropharyngeal exudate, posterior oropharyngeal edema, posterior oropharyngeal erythema or tonsillar abscesses.  No sinus ttp. Clear nasal rhinorrhea.    Eyes: Conjunctivae are normal. Right conjunctiva is not injected. Left conjunctiva is not injected.  Cobblestoning palpebral conjunctiva.  Neck: Normal range of motion.  Cardiovascular: Normal rate and normal heart sounds.   Pulses equal bilaterally  Pulmonary/Chest: Effort normal. No respiratory distress. He has no wheezes. He has no rales.  Abdominal: Soft. There is no tenderness.  Musculoskeletal: Normal range of motion. He exhibits tenderness. He exhibits no edema.        Left knee: He exhibits normal range of motion, no swelling, no effusion, no erythema and normal patellar mobility. Tenderness found. Medial joint line tenderness noted. No lateral joint line tenderness noted.  Mild crepitus with rom.  Neurological: He is alert and oriented to person, place, and time. He has normal strength. He displays normal reflexes. No sensory deficit.  Equal strength  Skin: Skin is warm and dry. No rash noted.  Psychiatric: He has a normal mood and affect.    ED Course  Procedures (including critical care time)  Labs Reviewed - No data to display Dg Knee Complete 4 Views Left  04/03/2013  *RADIOLOGY REPORT*  Clinical Data: Left knee pain.  LEFT KNEE - COMPLETE 4+ VIEW  Comparison: None.  Findings: Four views left knee are negative for fracture or dislocation.  Mild degenerative changes in the medial knee compartment.  Ossifications or enthesopathic changes along the inferior aspect of the patella.  IMPRESSION: Mild degenerative changes.  No acute bony abnormality.   Original Report Authenticated By: Richarda Overlie, M.D.      1. Osteoarthritis of left knee   2. Seasonal allergic rhinitis       MDM  Patients labs and/or radiological studies were viewed and considered during the medical decision making and disposition process.  Pt was prescribed oxycodone,  Encouraged RICE, continued ibuprofen,  Avoid weight bearing exercise - pt states also has an exercise bike, is trying to loose weight - encouraged this over the treadmill until sx improved.  Encouraged f/u with pcp prn.  Pt also to start back on allergy med,  Zyrtec prescribed, or may try coricidin which he has taken before,  Advised less likely to elevate bp which is elevated today,  Discussed with him - he will f/u with pcp regarding this.        Burgess Amor, PA-C 04/05/13 [phone]

## 2013-04-06 NOTE — ED Provider Notes (Signed)
  Medical screening examination/treatment/procedure(s) were performed by non-physician practitioner and as supervising physician I was immediately available for consultation/collaboration.    Merlon Alcorta D Allix Blomquist, MD 04/06/13 2344 

## 2013-05-03 ENCOUNTER — Encounter (HOSPITAL_COMMUNITY): Payer: Self-pay

## 2013-05-03 ENCOUNTER — Emergency Department (HOSPITAL_COMMUNITY)
Admission: EM | Admit: 2013-05-03 | Discharge: 2013-05-03 | Disposition: A | Discharge status: Home / Self Care | Payer: PRIVATE HEALTH INSURANCE | Attending: Emergency Medicine | Admitting: Emergency Medicine

## 2013-05-03 DIAGNOSIS — Z862 Personal history of diseases of the blood and blood-forming organs and certain disorders involving the immune mechanism: Secondary | ICD-10-CM | POA: Insufficient documentation

## 2013-05-03 DIAGNOSIS — J019 Acute sinusitis, unspecified: Secondary | ICD-10-CM | POA: Insufficient documentation

## 2013-05-03 DIAGNOSIS — R509 Fever, unspecified: Secondary | ICD-10-CM | POA: Insufficient documentation

## 2013-05-03 DIAGNOSIS — K089 Disorder of teeth and supporting structures, unspecified: Secondary | ICD-10-CM | POA: Insufficient documentation

## 2013-05-03 DIAGNOSIS — R51 Headache: Secondary | ICD-10-CM | POA: Insufficient documentation

## 2013-05-03 DIAGNOSIS — Z79899 Other long term (current) drug therapy: Secondary | ICD-10-CM | POA: Insufficient documentation

## 2013-05-03 DIAGNOSIS — Z8639 Personal history of other endocrine, nutritional and metabolic disease: Secondary | ICD-10-CM | POA: Insufficient documentation

## 2013-05-03 DIAGNOSIS — Z87891 Personal history of nicotine dependence: Secondary | ICD-10-CM | POA: Insufficient documentation

## 2013-05-03 DIAGNOSIS — I10 Essential (primary) hypertension: Secondary | ICD-10-CM | POA: Insufficient documentation

## 2013-05-03 DIAGNOSIS — J3489 Other specified disorders of nose and nasal sinuses: Secondary | ICD-10-CM | POA: Insufficient documentation

## 2013-05-03 MED ORDER — AMOXICILLIN 500 MG PO CAPS: 500.0000 mg | ORAL_CAPSULE | Freq: Three times a day (TID) | ORAL | Status: DC

## 2013-05-03 NOTE — ED Notes (Signed)
Pt c/o r earache since Sunday night.  Says pain radiates into left jaw and head.

## 2013-05-04 NOTE — ED Provider Notes (Signed)
History     CSN: [id number]  Arrival date & time 05/03/13  1011   First MD Initiated Contact with Patient 05/03/13 1103      Chief Complaint  Patient presents with  . Otalgia    (Consider location/radiation/quality/duration/timing/severity/associated sxs/prior treatment) HPI Comments: Cody Harmon is a 47 y.o. Male presenting with a 2 day history of right sided ear and cheek pain in association with nasal congestion without drainage.  The pain radiates into his right jaw and temple area.  He denies dental problems but his right upper teeth have been aching as well.  He has had subjective fevers as well.  He reports a history of chronic nasal congestion associated with allergies and uses a generic otc nasal spray daily which has not been effective this past week.  He denies cough, shortness of breath, dizziness, tinnitus and sore throat.  He has used ibuprofen with transient relief of pain.    The history is provided by the patient.    Past Medical History  Diagnosis Date  . Hypertension   . Gout   . Gout     History reviewed. No pertinent past surgical history.  Family History  Problem Relation Age of Onset  . Hypertension Mother   . Stroke Father   . Stroke Brother   . Osteoarthritis Brother     History  Substance Use Topics  . Smoking status: Former Smoker    Types: Cigars  . Smokeless tobacco: Not on file  . Alcohol Use: No      Review of Systems  Constitutional: Positive for fever.  HENT: Positive for ear pain, congestion and sinus pressure. Negative for sore throat, facial swelling, neck pain and ear discharge.   Eyes: Negative.  Negative for pain, discharge and redness.  Respiratory: Negative for chest tightness and shortness of breath.   Cardiovascular: Negative for chest pain.  Gastrointestinal: Negative for nausea and abdominal pain.  Genitourinary: Negative.   Musculoskeletal: Negative for joint swelling and arthralgias.  Skin: Negative.  Negative  for rash and wound.  Neurological: Negative for dizziness, weakness, light-headedness, numbness and headaches.  Psychiatric/Behavioral: Negative.     Allergies  Review of patient's allergies indicates no known allergies.  Home Medications   Current Outpatient Rx  Name  Route  Sig  Dispense  Refill  . fish oil-omega-3 fatty acids 1000 MG capsule   Oral   Take 1 g by mouth daily.         Marland Kitchen ibuprofen (ADVIL,MOTRIN) 200 MG tablet   Oral   Take 800 mg by mouth every 6 (six) hours as needed. Pain         . Multiple Vitamin (ONE-A-DAY MENS PO)   Oral   Take 1 tablet by mouth daily.           Marland Kitchen oxyCODONE-acetaminophen (PERCOCET/ROXICET) 5-325 MG per tablet   Oral   Take 1 tablet by mouth every 4 (four) hours as needed for pain.   20 tablet   0   . amoxicillin (AMOXIL) 500 MG capsule   Oral   Take 1 capsule (500 mg total) by mouth 3 (three) times daily.   30 capsule   0     BP 151/92  Pulse 85  Temp(Src) 98.8 F (37.1 C) (Oral)  Resp 20  Ht 6\' 2"  (1.88 m)  Wt 295 lb (133.811 kg)  BMI 37.86 kg/m2  SpO2 100%  Physical Exam  Constitutional: He is oriented to person, place, and time. He  appears well-developed and well-nourished.  HENT:  Head: Normocephalic and atraumatic.  Right Ear: Tympanic membrane, external ear and ear canal normal.  Left Ear: Tympanic membrane, external ear and ear canal normal.  Nose: Mucosal edema and rhinorrhea present. Right sinus exhibits maxillary sinus tenderness.  Mouth/Throat: Uvula is midline, oropharynx is clear and moist and mucous membranes are normal. Normal dentition. No dental abscesses. No oropharyngeal exudate, posterior oropharyngeal edema, posterior oropharyngeal erythema or tonsillar abscesses.  Eyes: Conjunctivae are normal.  Cardiovascular: Normal rate and normal heart sounds.   Pulmonary/Chest: Effort normal. No respiratory distress. He has no wheezes. He has no rales.  Abdominal: Soft. There is no tenderness.   Musculoskeletal: Normal range of motion.  Neurological: He is alert and oriented to person, place, and time.  Skin: Skin is warm and dry. No rash noted.  Psychiatric: He has a normal mood and affect.    ED Course  Procedures (including critical care time)  Labs Reviewed - No data to display No results found.   1. Sinusitis, acute       MDM  Pt was prescribed amoxil.  Encouraged to consider backing off on otc nasal spray once he is over this infection due to rebound congestion and htn.  Suggested coricidin products given h/o htn and elevated bp today.  Menthol lozenges,  Warm compresses to face,  Recheck by pcp if not improved over the next week.        Burgess Amor, PA-C 05/04/13 [phone]

## 2013-05-04 NOTE — ED Provider Notes (Signed)
Medical screening examination/treatment/procedure(s) were performed by non-physician practitioner and as supervising physician I was immediately available for consultation/collaboration.   Carleene Cooper III, MD 05/04/13 1409

## 2013-05-16 IMAGING — CR DG CHEST 2V
2 series · 2 of 2 positions shown · non-contrast
Comparison: None.

CLINICAL DATA: Shortness of breath and cough.

CHEST - 2 VIEW

[view not recorded (1 of 2)]
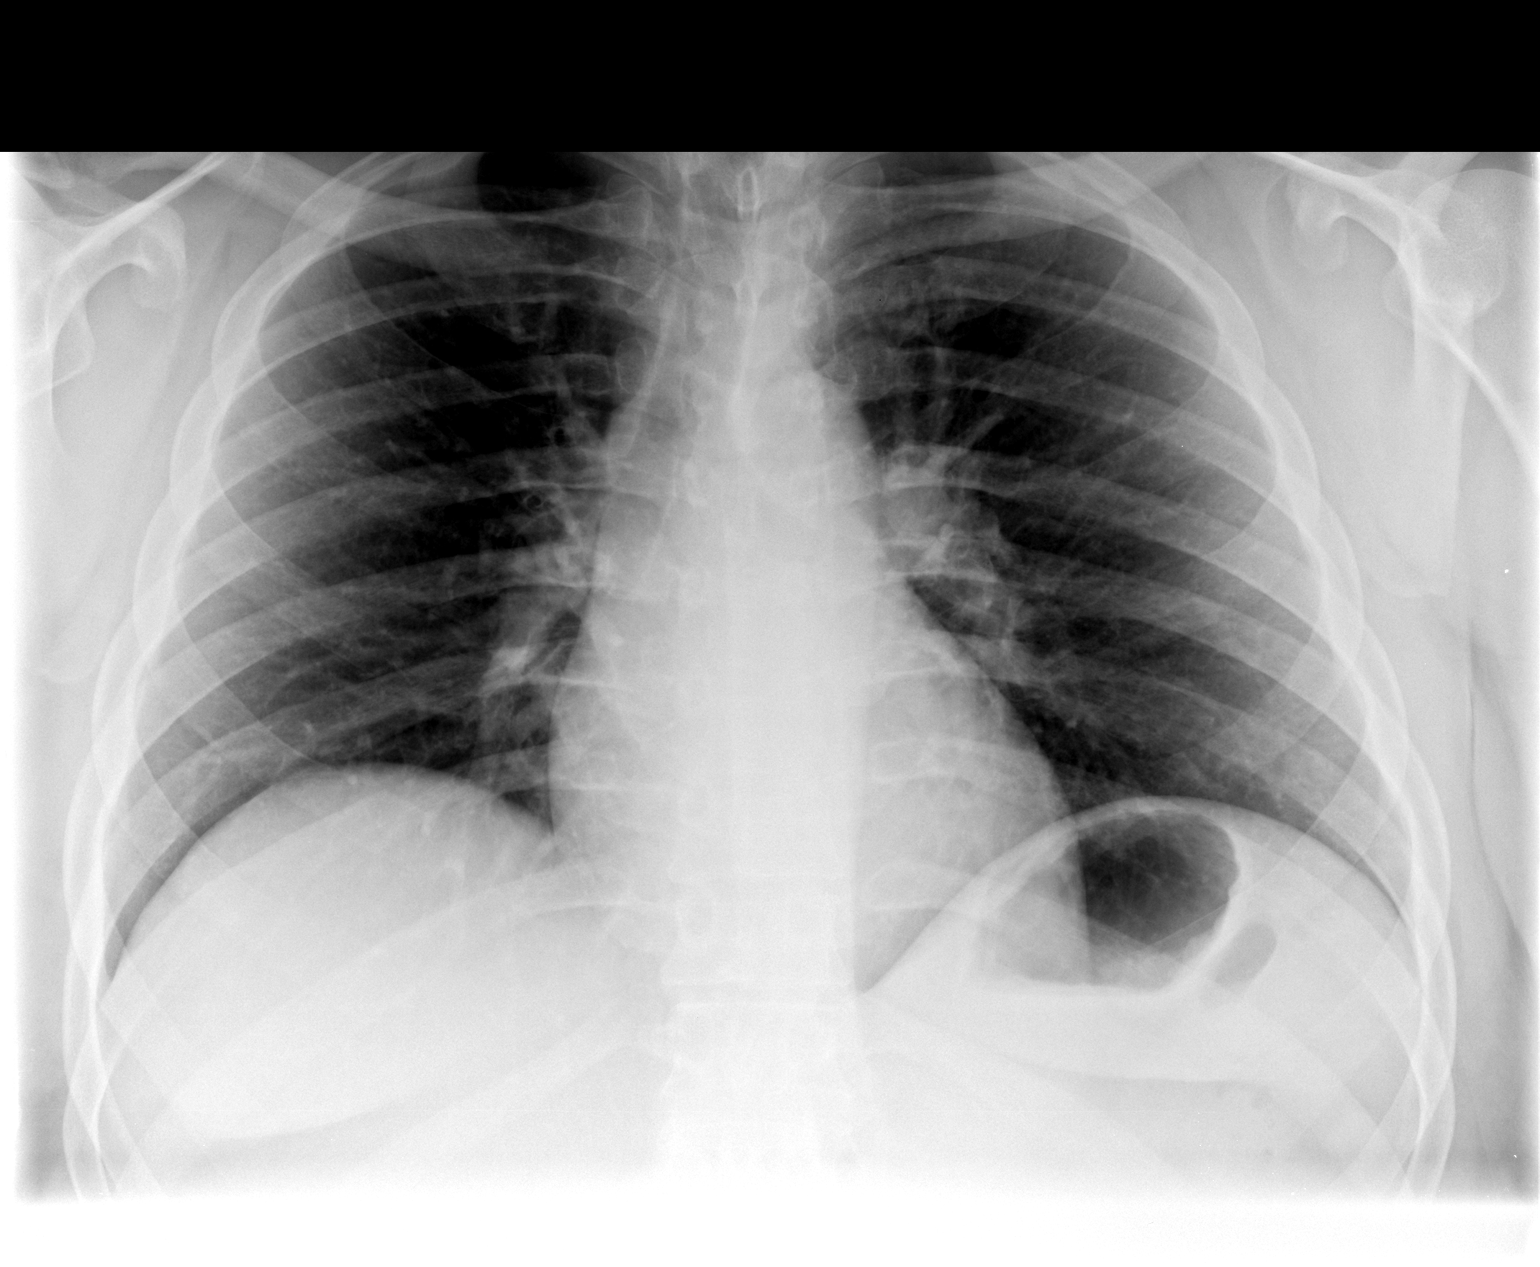

[view not recorded (2 of 2)]
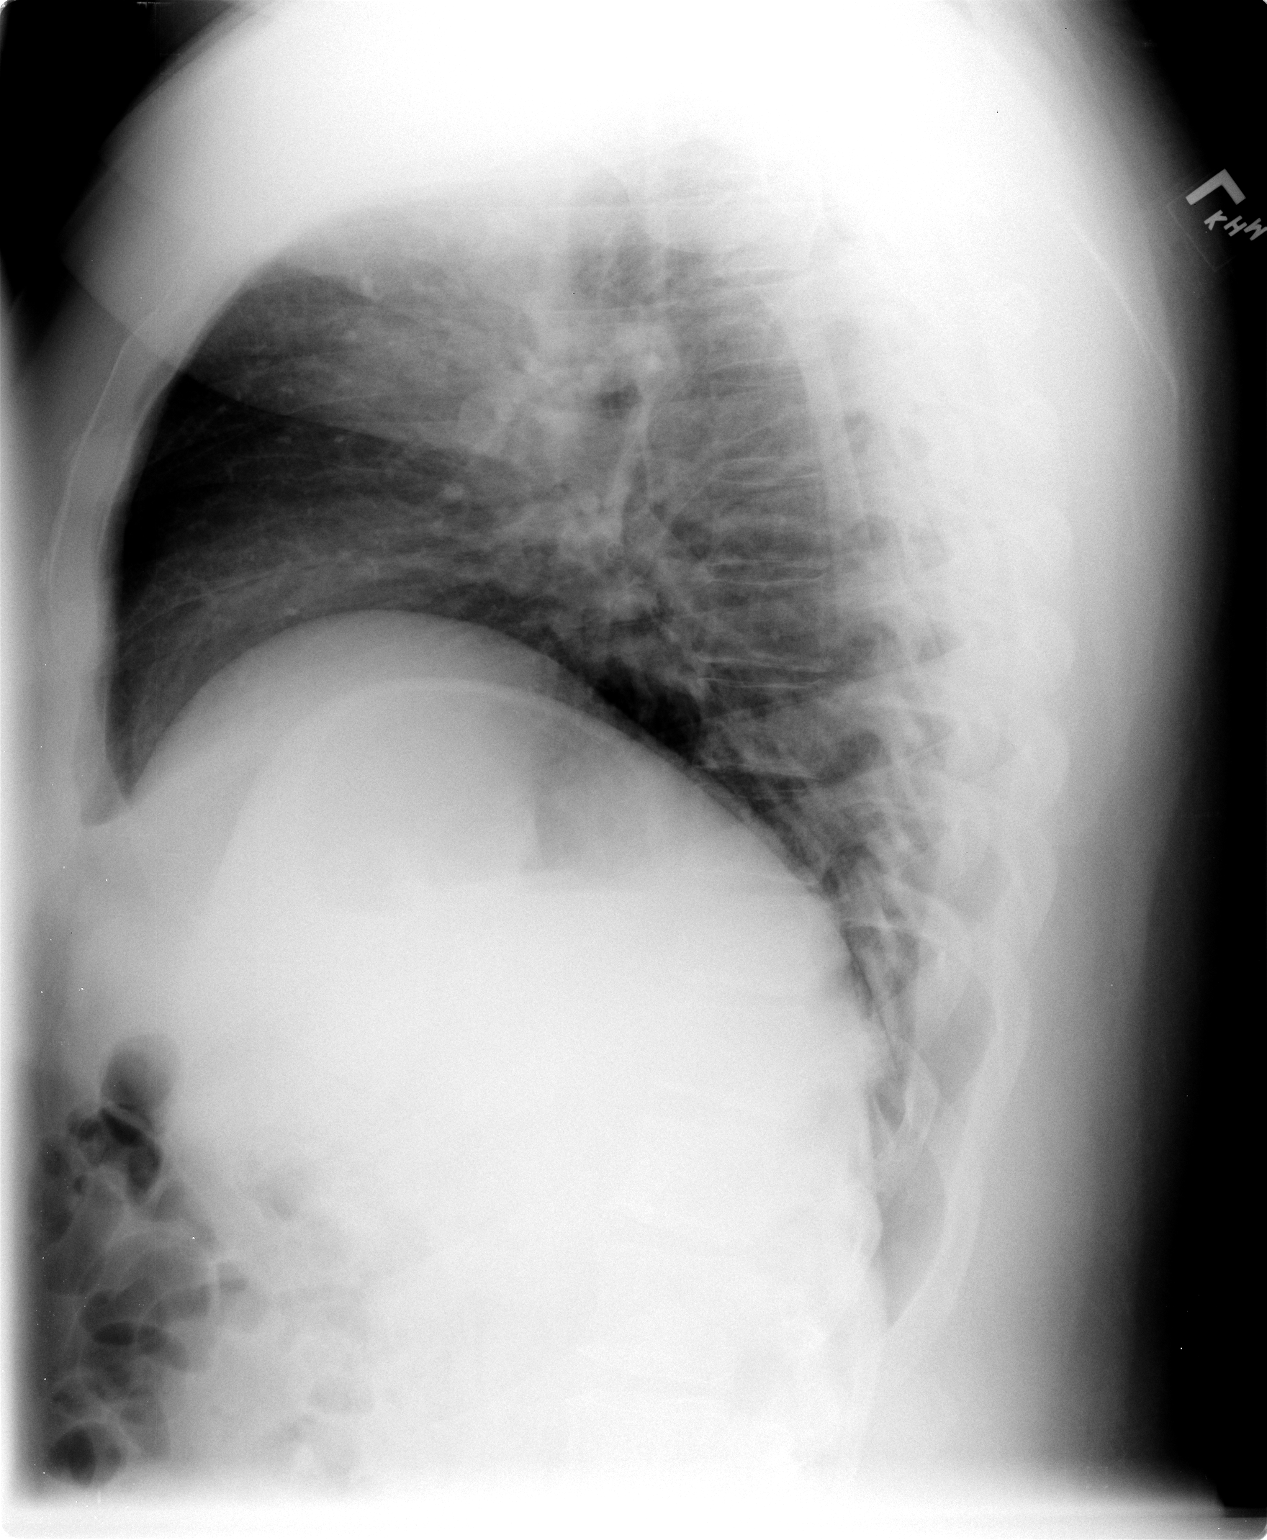

[2 of 2 positions shown; findings below may reference images not displayed]

FINDINGS: Lungs clear.  Heart size is normal.  No pneumothorax or
effusion.
IMPRESSION: Negative chest.

## 2013-05-31 ENCOUNTER — Emergency Department (HOSPITAL_COMMUNITY)
Admission: EM | Admit: 2013-05-31 | Discharge: 2013-05-31 | Disposition: A | Discharge status: Home / Self Care | Payer: PRIVATE HEALTH INSURANCE | Attending: Emergency Medicine | Admitting: Emergency Medicine

## 2013-05-31 ENCOUNTER — Encounter (HOSPITAL_COMMUNITY): Payer: Self-pay | Admitting: *Deleted

## 2013-05-31 DIAGNOSIS — I10 Essential (primary) hypertension: Secondary | ICD-10-CM | POA: Insufficient documentation

## 2013-05-31 DIAGNOSIS — M109 Gout, unspecified: Secondary | ICD-10-CM | POA: Insufficient documentation

## 2013-05-31 DIAGNOSIS — Z87891 Personal history of nicotine dependence: Secondary | ICD-10-CM | POA: Insufficient documentation

## 2013-05-31 DIAGNOSIS — Z79899 Other long term (current) drug therapy: Secondary | ICD-10-CM | POA: Insufficient documentation

## 2013-05-31 MED ORDER — INDOMETHACIN 25 MG PO CAPS: 25.0000 mg | ORAL_CAPSULE | Freq: Three times a day (TID) | ORAL | Status: DC | PRN

## 2013-05-31 MED ORDER — TRAMADOL HCL 50 MG PO TABS: 50.0000 mg | ORAL_TABLET | Freq: Four times a day (QID) | ORAL | Status: DC | PRN

## 2013-05-31 NOTE — ED Notes (Signed)
Pt c/o right foot pain that started two days ago, denies any injury, has hx of gout.

## 2013-05-31 NOTE — ED Provider Notes (Signed)
History    CSN: [id number] Arrival date & time 05/31/13  1012  First MD Initiated Contact with Patient 05/31/13 1049     Chief Complaint  Patient presents with  . Foot Pain   (Consider location/radiation/quality/duration/timing/severity/associated sxs/prior Treatment) Patient is a 47 y.o. male presenting with lower extremity pain. The history is provided by the patient.  Foot Pain This is a new problem. The current episode started yesterday. The problem occurs constantly. The problem has been unchanged. Pertinent negatives include no chills, fever, headaches, nausea or vomiting.   Cody Harmon is a 47 y.o. male who presents to the ED with right foot pain that started 2 days ago. He has a history of gout and this feels like it did before. He denies any other problems. The pain is located in the right great toe and radiates to the top of the foot. The pain is severe. He has difficulty walking or wearing shoes due to the pain.  Past Medical History  Diagnosis Date  . Hypertension   . Gout   . Gout    History reviewed. No pertinent past surgical history. Family History  Problem Relation Age of Onset  . Hypertension Mother   . Stroke Father   . Stroke Brother   . Osteoarthritis Brother    History  Substance Use Topics  . Smoking status: Former Smoker    Types: Cigars  . Smokeless tobacco: Not on file  . Alcohol Use: No    Review of Systems  Constitutional: Negative for fever and chills.  Gastrointestinal: Negative for nausea and vomiting.  Musculoskeletal:       Right foot pain.  Skin: Negative for wound.  Neurological: Negative for headaches.  Psychiatric/Behavioral: The patient is not nervous/anxious.     Allergies  Review of patient's allergies indicates no known allergies.  Home Medications   Current Outpatient Rx  Name  Route  Sig  Dispense  Refill  . fish oil-omega-3 fatty acids 1000 MG capsule   Oral   Take 1 g by mouth daily.         Marland Kitchen ibuprofen  (ADVIL,MOTRIN) 200 MG tablet   Oral   Take 800 mg by mouth every 6 (six) hours as needed. Pain         . Multiple Vitamin (ONE-A-DAY MENS PO)   Oral   Take 1 tablet by mouth daily.           Marland Kitchen oxymetazoline (AFRIN) 0.05 % nasal spray   Nasal   Place 2 sprays into the nose daily as needed for congestion.          BP 161/102  Pulse 61  Temp(Src) 98.1 F (36.7 C)  Resp 16  Ht 6\' 2"  (1.88 m)  Wt 285 lb (129.275 kg)  BMI 36.58 kg/m2 Physical Exam  Nursing note and vitals reviewed. Constitutional: He is oriented to person, place, and time. He appears well-developed and well-nourished. No distress.  HENT:  Head: Normocephalic.  Eyes: EOM are normal.  Neck: Neck supple.  Cardiovascular: Normal rate.   Pulmonary/Chest: Effort normal.  Musculoskeletal:       Right foot: He exhibits tenderness and swelling. He exhibits normal range of motion, no deformity and no laceration.       Feet:  Neurological: He is alert and oriented to person, place, and time. No cranial nerve deficit.  Skin: Skin is warm and dry.  Psychiatric: He has a normal mood and affect. His behavior is normal.  ED Course  Procedures   MDM  47 y.o. male with pain right great toe radiating to foot. Will treat as gout. Discussed with the patient importance of follow up with PCP for lab work and to follow his episodes of pain.  Discussed with the patient clinical findings and plan of care and all questioned fully answered. He will return if any problems arise.    Medication List    TAKE these medications       indomethacin 25 MG capsule  Commonly known as:  INDOCIN  Take 1 capsule (25 mg total) by mouth 3 (three) times daily as needed.     traMADol 50 MG tablet  Commonly known as:  ULTRAM  Take 1 tablet (50 mg total) by mouth every 6 (six) hours as needed for pain.      ASK your doctor about these medications       fish oil-omega-3 fatty acids 1000 MG capsule  Take 1 g by mouth daily.      ibuprofen 200 MG tablet  Commonly known as:  ADVIL,MOTRIN  Take 800 mg by mouth every 6 (six) hours as needed. Pain     ONE-A-DAY MENS PO  Take 1 tablet by mouth daily.     oxymetazoline 0.05 % nasal spray  Commonly known as:  AFRIN  Place 2 sprays into the nose daily as needed for congestion.         [place], [place] 06/01/13 [phone]

## 2013-06-06 NOTE — ED Provider Notes (Signed)
Medical screening examination/treatment/procedure(s) were performed by non-physician practitioner and as supervising physician I was immediately available for consultation/collaboration. Maneh Sieben, MD, FACEP   Amelio Brosky L Marios Gaiser, MD 06/06/13 1303 

## 2013-07-19 ENCOUNTER — Encounter (HOSPITAL_COMMUNITY): Payer: Self-pay | Admitting: Emergency Medicine

## 2013-07-19 ENCOUNTER — Emergency Department (HOSPITAL_COMMUNITY)
Admission: EM | Admit: 2013-07-19 | Discharge: 2013-07-19 | Disposition: A | Discharge status: Home / Self Care | Payer: PRIVATE HEALTH INSURANCE | Attending: Emergency Medicine | Admitting: Emergency Medicine

## 2013-07-19 DIAGNOSIS — IMO0002 Reserved for concepts with insufficient information to code with codable children: Secondary | ICD-10-CM | POA: Insufficient documentation

## 2013-07-19 DIAGNOSIS — Z79899 Other long term (current) drug therapy: Secondary | ICD-10-CM | POA: Insufficient documentation

## 2013-07-19 DIAGNOSIS — M25579 Pain in unspecified ankle and joints of unspecified foot: Secondary | ICD-10-CM | POA: Insufficient documentation

## 2013-07-19 DIAGNOSIS — Z87891 Personal history of nicotine dependence: Secondary | ICD-10-CM | POA: Insufficient documentation

## 2013-07-19 DIAGNOSIS — M109 Gout, unspecified: Secondary | ICD-10-CM | POA: Insufficient documentation

## 2013-07-19 DIAGNOSIS — I10 Essential (primary) hypertension: Secondary | ICD-10-CM | POA: Insufficient documentation

## 2013-07-19 DIAGNOSIS — M79672 Pain in left foot: Secondary | ICD-10-CM

## 2013-07-19 MED ORDER — PREDNISONE 20 MG PO TABS: ORAL_TABLET | ORAL | Status: DC

## 2013-07-19 NOTE — ED Notes (Signed)
Pt c/o pain and swelling to left foot. Pt has hx of gout.

## 2013-07-22 NOTE — ED Provider Notes (Signed)
CSN: [id number]     Arrival date & time 07/19/13  1032 History     First MD Initiated Contact with Patient 07/19/13 1116     Chief Complaint  Patient presents with  . Gout   (Consider location/radiation/quality/duration/timing/severity/associated sxs/prior Treatment) HPI Comments: Cody Harmon is a 47 y.o. male who presents to the Emergency Department complaining of pain to his left foot.  States he has hx of recurrent gout attacks and pain to his foot feels similar to previous gout attacks.  He denies numbness, redness, swelling or skin lesions of the foot.  Nothing has made the pain better.    Patient is a 47 y.o. male presenting with lower extremity pain. The history is provided by the patient.  Foot Pain This is a recurrent problem. The current episode started in the past 7 days. The problem occurs constantly. The problem has been unchanged. Associated symptoms include arthralgias. Pertinent negatives include no chills, fever, headaches, joint swelling, nausea, neck pain, numbness, rash, swollen glands, vomiting or weakness. The symptoms are aggravated by standing and walking. He has tried position changes for the symptoms. The treatment provided no relief.    Past Medical History  Diagnosis Date  . Hypertension   . Gout   . Gout    History reviewed. No pertinent past surgical history. Family History  Problem Relation Age of Onset  . Hypertension Mother   . Stroke Father   . Stroke Brother   . Osteoarthritis Brother    History  Substance Use Topics  . Smoking status: Former Smoker    Types: Cigars  . Smokeless tobacco: Not on file  . Alcohol Use: No    Review of Systems  Constitutional: Negative for fever and chills.  HENT: Negative for neck pain.   Gastrointestinal: Negative for nausea and vomiting.  Genitourinary: Negative for dysuria and difficulty urinating.  Musculoskeletal: Positive for arthralgias. Negative for joint swelling.  Skin: Negative for color  change, rash and wound.  Neurological: Negative for weakness, numbness and headaches.  All other systems reviewed and are negative.    Allergies  Review of patient's allergies indicates no known allergies.  Home Medications   Current Outpatient Rx  Name  Route  Sig  Dispense  Refill  . fish oil-omega-3 fatty acids 1000 MG capsule   Oral   Take 1 g by mouth daily.         . Ginkgo Biloba 40 MG TABS   Oral   Take 1 tablet by mouth daily.         Marland Kitchen ibuprofen (ADVIL,MOTRIN) 200 MG tablet   Oral   Take 800 mg by mouth every 6 (six) hours as needed. Pain         . Multiple Vitamin (ONE-A-DAY MENS PO)   Oral   Take 1 tablet by mouth daily.           Marland Kitchen oxymetazoline (AFRIN) 0.05 % nasal spray   Nasal   Place 2 sprays into the nose daily as needed for congestion.         . indomethacin (INDOCIN) 25 MG capsule   Oral   Take 1 capsule (25 mg total) by mouth 3 (three) times daily as needed.   30 capsule   0   . predniSONE (DELTASONE) 20 MG tablet      Take 3 tablets po qd x 2 days, then 2 tablets po qd x 2 days, then 1 tablet po qd x 2 days  12 tablet   0    BP 139/100  Pulse 69  Temp(Src) 98.3 F (36.8 C) (Oral)  Resp 18  SpO2 100% Physical Exam  Nursing note and vitals reviewed. Constitutional: He is oriented to person, place, and time. He appears well-developed and well-nourished. No distress.  HENT:  Head: Normocephalic and atraumatic.  Cardiovascular: Normal rate, regular rhythm, normal heart sounds and intact distal pulses.   No murmur heard. Pulmonary/Chest: Effort normal and breath sounds normal. No respiratory distress.  Musculoskeletal: He exhibits tenderness. He exhibits no edema.  ttp of the left medial foot including the proximal great toe and instep.  ROM is preserved.  DP pulse is brisk,distal sensation intact.  No erythema, abrasion, bruising, skin lesions or bony deformity.  Toes appear nml. No proximal tenderness.  Neurological: He is  alert and oriented to person, place, and time. He exhibits normal muscle tone. Coordination normal.  Skin: Skin is warm and dry.    ED Course   Procedures (including critical care time)  Labs Reviewed - No data to display No results found. 1. Foot pain, left   2. Gout     MDM    ttp of the left medial foot w/o clinical sx's of cellulitis or bony injury.  NV intact.  Pt has multiple ED visits for gout, I have advised him that he needs to f/u with his PMD for his recurrent gout flares.  No hx of injury to indicate need for x-ray on this visit, pt had imaging of foot 10/2012    He is well appearing and stable for d/c.  Pt verbalized understanding and agrees to care plan  Alica Shellhammer L. Charleen Madera, PA-C 07/22/13 1254

## 2013-07-22 NOTE — ED Provider Notes (Signed)
Medical screening examination/treatment/procedure(s) were performed by non-physician practitioner and as supervising physician I was immediately available for consultation/collaboration.   Keelyn Monjaras W. Melva Faux, MD 07/22/13 1832 

## 2013-08-03 ENCOUNTER — Emergency Department (HOSPITAL_COMMUNITY)
Admission: EM | Admit: 2013-08-03 | Discharge: 2013-08-03 | Disposition: A | Discharge status: Home / Self Care | Payer: PRIVATE HEALTH INSURANCE | Attending: Emergency Medicine | Admitting: Emergency Medicine

## 2013-08-03 ENCOUNTER — Emergency Department (HOSPITAL_COMMUNITY): Payer: PRIVATE HEALTH INSURANCE

## 2013-08-03 ENCOUNTER — Encounter (HOSPITAL_COMMUNITY): Payer: Self-pay

## 2013-08-03 DIAGNOSIS — Z87891 Personal history of nicotine dependence: Secondary | ICD-10-CM | POA: Insufficient documentation

## 2013-08-03 DIAGNOSIS — Z862 Personal history of diseases of the blood and blood-forming organs and certain disorders involving the immune mechanism: Secondary | ICD-10-CM | POA: Insufficient documentation

## 2013-08-03 DIAGNOSIS — R42 Dizziness and giddiness: Secondary | ICD-10-CM | POA: Insufficient documentation

## 2013-08-03 DIAGNOSIS — Z8639 Personal history of other endocrine, nutritional and metabolic disease: Secondary | ICD-10-CM | POA: Insufficient documentation

## 2013-08-03 DIAGNOSIS — K219 Gastro-esophageal reflux disease without esophagitis: Secondary | ICD-10-CM | POA: Insufficient documentation

## 2013-08-03 DIAGNOSIS — M109 Gout, unspecified: Secondary | ICD-10-CM | POA: Insufficient documentation

## 2013-08-03 DIAGNOSIS — I10 Essential (primary) hypertension: Secondary | ICD-10-CM | POA: Insufficient documentation

## 2013-08-03 DIAGNOSIS — Z79899 Other long term (current) drug therapy: Secondary | ICD-10-CM | POA: Insufficient documentation

## 2013-08-03 HISTORY — DX: Pure hypercholesterolemia, unspecified: E78.00

## 2013-08-03 LAB — POCT I-STAT TROPONIN I

## 2013-08-03 LAB — CBC WITH DIFFERENTIAL/PLATELET
Basophils Absolute: 0.1 10*3/uL (ref 0.0–0.1)
Basophils Relative: 1 % (ref 0–1)
Eosinophils Relative: 3 % (ref 0–5)
HCT: 46 % (ref 39.0–52.0)
Hemoglobin: 16 g/dL (ref 13.0–17.0)
MCH: 28 pg (ref 26.0–34.0)
MCHC: 34.8 g/dL (ref 30.0–36.0)
MCV: 80.6 fL (ref 78.0–100.0)
Monocytes Absolute: 0.7 10*3/uL (ref 0.1–1.0)
Monocytes Relative: 8 % (ref 3–12)
Neutro Abs: 5.7 10*3/uL (ref 1.7–7.7)
RDW: 14 % (ref 11.5–15.5)

## 2013-08-03 LAB — BASIC METABOLIC PANEL
BUN: 14 mg/dL (ref 6–23)
Calcium: 9.8 mg/dL (ref 8.4–10.5)
Chloride: 96 mEq/L (ref 96–112)
Creatinine, Ser: 1.32 mg/dL (ref 0.50–1.35)
GFR calc Af Amer: 72 mL/min — ABNORMAL LOW (ref >90)

## 2013-08-03 MED ORDER — NAPROXEN 500 MG PO TABS: ORAL_TABLET | ORAL | Status: DC

## 2013-08-03 MED ORDER — RANITIDINE HCL 150 MG PO CAPS: 150.0000 mg | ORAL_CAPSULE | Freq: Every day | ORAL | Status: DC

## 2013-08-03 MED ORDER — OXYCODONE-ACETAMINOPHEN 5-325 MG PO TABS
1.0000 | ORAL_TABLET | Freq: Once | ORAL | Status: AC
  Administered 2013-08-03: 1 via ORAL
  Filled 2013-08-03: qty 1

## 2013-08-03 MED ORDER — HYDROCODONE-ACETAMINOPHEN 5-325 MG PO TABS: 1.0000 | ORAL_TABLET | Freq: Four times a day (QID) | ORAL | Status: DC | PRN

## 2013-08-03 MED ORDER — LISINOPRIL 20 MG PO TABS: 20.0000 mg | ORAL_TABLET | Freq: Every day | ORAL | Status: DC

## 2013-08-03 NOTE — ED Notes (Signed)
nad noted prior to dc. Dc instructions reviewed with pt and explained along with 4 scripts given to pt. Ambulated out without difficulty. Denies any cp at time of dc. Pt still c/o pain to left foot

## 2013-08-03 NOTE — ED Provider Notes (Signed)
CSN: [id number]     Arrival date & time 08/03/13  0730 History  This chart was scribed for Benny Lennert, MD by Quintella Reichert, ED scribe.  This patient was seen in room [facility]14/[facility]14 and the patient's care was started at 7:57 AM.   Chief Complaint  Patient presents with  . Chest Pain  . Gout    Patient is a 47 y.o. male presenting with chest pain. The history is provided by the patient. No language interpreter was used.  Chest Pain Chest pain location: Central. Pain quality: pressure   Pain radiates to:  Does not radiate Pain radiates to the back: no   Pain severity:  No pain Timing:  Constant Progression:  Partially resolved Chronicity:  New Ineffective treatments:  None tried Associated symptoms: nausea   Associated symptoms: no abdominal pain, no back pain, no cough, no fatigue and no headache   Associated symptoms comment:  Lightheadedness Risk factors: high cholesterol, hypertension and male sex     HPI Comments: Cody Harmon is a 47 y.o. male with h/o HTN, hypercholesteremia and gout who presents to the Emergency Department complaining of 3 hours of constant central non-radiating chest pressure with associated lightheadedness and nausea.  Pt states his chest pressure has partially resolved since onset but he is still feeling lightheaded.  He denies SOB.  He notes a h/o "indigestion" and initially attributed his chest pressure to this.  He does not take any medications for his indigestion.  Pt also complains of throbbing pain in his left foot which he states is similar to past gout flare-ups.  He has been taking prednisone without relief.  He notes that he used to take indomethacin but stopped taking it out of concern that it would raise his BP.    Pt has no PCP   Past Medical History  Diagnosis Date  . Hypertension   . Gout   . Gout   . Hypercholesteremia     History reviewed. No pertinent past surgical history.   Family History  Problem Relation Age of Onset   . Hypertension Mother   . Stroke Father   . Stroke Brother   . Osteoarthritis Brother     History  Substance Use Topics  . Smoking status: Former Smoker    Types: Cigars  . Smokeless tobacco: Not on file  . Alcohol Use: No    Review of Systems  Constitutional: Negative for appetite change and fatigue.  HENT: Negative for congestion, sinus pressure and ear discharge.   Eyes: Negative for discharge.  Respiratory: Negative for cough.   Cardiovascular: Positive for chest pain.  Gastrointestinal: Positive for nausea. Negative for abdominal pain and diarrhea.  Genitourinary: Negative for frequency and hematuria.  Musculoskeletal: Negative for back pain.  Skin: Negative for rash.  Neurological: Positive for light-headedness. Negative for seizures and headaches.  Psychiatric/Behavioral: Negative for hallucinations.     Allergies  Review of patient's allergies indicates no known allergies.  Home Medications   Current Outpatient Rx  Name  Route  Sig  Dispense  Refill  . fish oil-omega-3 fatty acids 1000 MG capsule   Oral   Take 1 g by mouth daily.         . Ginkgo Biloba 40 MG TABS   Oral   Take 1 tablet by mouth daily.         Marland Kitchen ibuprofen (ADVIL,MOTRIN) 200 MG tablet   Oral   Take 800 mg by mouth every 6 (six) hours as needed.  Pain         . indomethacin (INDOCIN) 25 MG capsule   Oral   Take 1 capsule (25 mg total) by mouth 3 (three) times daily as needed.   30 capsule   0   . Multiple Vitamin (ONE-A-DAY MENS PO)   Oral   Take 1 tablet by mouth daily.           Marland Kitchen oxymetazoline (AFRIN) 0.05 % nasal spray   Nasal   Place 2 sprays into the nose daily as needed for congestion.         . predniSONE (DELTASONE) 20 MG tablet      Take 3 tablets po qd x 2 days, then 2 tablets po qd x 2 days, then 1 tablet po qd x 2 days   12 tablet   0    BP 182/95  Pulse 84  Temp(Src) 98.4 F (36.9 C)  Resp 16  Ht 6\' 2"  (1.88 m)  Wt 285 lb (129.275 kg)  BMI  36.58 kg/m2  SpO2 99%  Physical Exam  Nursing note and vitals reviewed. Constitutional: He is oriented to person, place, and time. He appears well-developed.  HENT:  Head: Normocephalic.  Eyes: Conjunctivae and EOM are normal. No scleral icterus.  Neck: Neck supple. No thyromegaly present.  Cardiovascular: Normal rate, regular rhythm and normal heart sounds.  Exam reveals no gallop and no friction rub.   No murmur heard. Pulmonary/Chest: Effort normal and breath sounds normal. No stridor. He has no wheezes. He has no rales. He exhibits no tenderness.  Abdominal: He exhibits no distension. There is no tenderness. There is no rebound.  Musculoskeletal: Normal range of motion. He exhibits tenderness.  [place] to bottom of left foot over left 4th, 5th and 6th metatarsals  Lymphadenopathy:    He has no cervical adenopathy.  Neurological: He is oriented to person, place, and time. Coordination normal.  Skin: No rash noted. No erythema.  Psychiatric: He has a normal mood and affect. His behavior is normal.    ED Course  Procedures (including critical care time)  DIAGNOSTIC STUDIES: Oxygen Saturation is 99% on room air, normal by my interpretation.    COORDINATION OF CARE: 8:00 AM-Discussed treatment plan which includes cardiac workup with pt at bedside and pt agreed to plan.   9:54 AM: Informed pt that labs and imaging do not suggest a cardiac cause of symptoms.  Discussed treatment plan which includes medication for his indigestion and gout and f/u with PCP with pt at bedside and pt agreed to plan.    Labs Review Labs Reviewed  BASIC METABOLIC PANEL - Abnormal; Notable for the following:    Potassium 3.2 (*)    Glucose, Bld 107 (*)    GFR calc non Af Amer 62 (*)    GFR calc Af Amer 72 (*)    All other components within normal limits  CBC WITH DIFFERENTIAL  POCT I-STAT TROPONIN I   Imaging Review Dg Chest 2 View  08/03/2013   *RADIOLOGY REPORT*  Clinical Data: Chest pain   CHEST - 2 VIEW  Comparison: October 09, 2012  Findings: Lungs clear.  Heart size and pulmonary vascularity normal.  No adenopathy.  No bone lesions.  IMPRESSION: No abnormality noted.   Original Report Authenticated By: Bretta Bang, M.D.    Date: 08/03/2013  Rate: 82  Rhythm: normal sinus rhythm  QRS Axis: normal  Intervals: normal  ST/T Wave abnormalities: nonspecific ST changes  Conduction Disutrbances:none  Narrative Interpretation:  Old EKG Reviewed: none available   MDM  No diagnosis found.     The chart was scribed for me under my direct supervision.  I personally performed the history, physical, and medical decision making and all procedures in the evaluation of this patient.Benny Lennert, MD 08/03/13 1001

## 2013-08-03 NOTE — ED Notes (Signed)
Pt began having chest pressure last evening while working. C/o nausea and dizziness as well. Pressure is central and denies radiation. Pt a/o c/o gout to left foot.

## 2013-09-03 ENCOUNTER — Encounter (HOSPITAL_COMMUNITY): Payer: Self-pay

## 2013-09-03 ENCOUNTER — Emergency Department (HOSPITAL_COMMUNITY)
Admission: EM | Admit: 2013-09-03 | Discharge: 2013-09-03 | Disposition: A | Discharge status: Home / Self Care | Payer: PRIVATE HEALTH INSURANCE | Attending: Emergency Medicine | Admitting: Emergency Medicine

## 2013-09-03 ENCOUNTER — Emergency Department (HOSPITAL_COMMUNITY): Payer: PRIVATE HEALTH INSURANCE

## 2013-09-03 DIAGNOSIS — Z792 Long term (current) use of antibiotics: Secondary | ICD-10-CM | POA: Insufficient documentation

## 2013-09-03 DIAGNOSIS — Z87891 Personal history of nicotine dependence: Secondary | ICD-10-CM | POA: Insufficient documentation

## 2013-09-03 DIAGNOSIS — Z79899 Other long term (current) drug therapy: Secondary | ICD-10-CM | POA: Insufficient documentation

## 2013-09-03 DIAGNOSIS — I10 Essential (primary) hypertension: Secondary | ICD-10-CM | POA: Insufficient documentation

## 2013-09-03 DIAGNOSIS — Z862 Personal history of diseases of the blood and blood-forming organs and certain disorders involving the immune mechanism: Secondary | ICD-10-CM | POA: Insufficient documentation

## 2013-09-03 DIAGNOSIS — J40 Bronchitis, not specified as acute or chronic: Secondary | ICD-10-CM | POA: Insufficient documentation

## 2013-09-03 DIAGNOSIS — Z8639 Personal history of other endocrine, nutritional and metabolic disease: Secondary | ICD-10-CM | POA: Insufficient documentation

## 2013-09-03 DIAGNOSIS — J329 Chronic sinusitis, unspecified: Secondary | ICD-10-CM | POA: Insufficient documentation

## 2013-09-03 MED ORDER — TRAMADOL HCL 50 MG PO TABS: 50.0000 mg | ORAL_TABLET | Freq: Four times a day (QID) | ORAL | Status: DC | PRN

## 2013-09-03 MED ORDER — AMOXICILLIN 500 MG PO CAPS: 500.0000 mg | ORAL_CAPSULE | Freq: Three times a day (TID) | ORAL | Status: DC

## 2013-09-03 NOTE — ED Provider Notes (Signed)
CSN: [id number]     Arrival date & time 09/03/13  1926 History   First MD Initiated Contact with Patient 09/03/13 1937     Chief Complaint  Patient presents with  . Influenza   (Consider location/radiation/quality/duration/timing/severity/associated sxs/prior Treatment) Patient is a 47 y.o. male presenting with flu symptoms. The history is provided by the patient (pt complains of cough and nasal congestion).  Influenza Presenting symptoms: cough   Presenting symptoms: no diarrhea, no fatigue and no headaches   Severity:  Moderate Onset quality:  Sudden Progression:  Waxing and waning Chronicity:  New Relieved by:  Nothing Worsened by:  Nothing tried Associated symptoms: no congestion     Past Medical History  Diagnosis Date  . Hypertension   . Gout   . Gout   . Hypercholesteremia    History reviewed. No pertinent past surgical history. Family History  Problem Relation Age of Onset  . Hypertension Mother   . Stroke Father   . Stroke Brother   . Osteoarthritis Brother    History  Substance Use Topics  . Smoking status: Former Smoker    Types: Cigars  . Smokeless tobacco: Not on file  . Alcohol Use: No    Review of Systems  Constitutional: Negative for appetite change and fatigue.  HENT: Negative for congestion, sinus pressure and ear discharge.   Eyes: Negative for discharge.  Respiratory: Positive for cough.   Cardiovascular: Negative for chest pain.  Gastrointestinal: Negative for abdominal pain and diarrhea.  Genitourinary: Negative for frequency and hematuria.  Musculoskeletal: Negative for back pain.  Skin: Negative for rash.  Neurological: Negative for seizures and headaches.  Psychiatric/Behavioral: Negative for hallucinations.    Allergies  Review of patient's allergies indicates no known allergies.  Home Medications   Current Outpatient Rx  Name  Route  Sig  Dispense  Refill  . Ascorbic Acid (VITAMIN C PO)   Oral   Take 1 tablet by mouth  once.         . fish oil-omega-3 fatty acids 1000 MG capsule   Oral   Take 1 g by mouth daily.         . Ginkgo Biloba 40 MG TABS   Oral   Take 1 tablet by mouth daily.         Marland Kitchen ibuprofen (ADVIL,MOTRIN) 200 MG tablet   Oral   Take 800 mg by mouth every 6 (six) hours as needed. Pain         . lisinopril (PRINIVIL,ZESTRIL) 20 MG tablet   Oral   Take 1 tablet (20 mg total) by mouth daily.   30 tablet   0   . Multiple Vitamin (ONE-A-DAY MENS PO)   Oral   Take 1 tablet by mouth daily.           Marland Kitchen oxymetazoline (AFRIN) 0.05 % nasal spray   Nasal   Place 2 sprays into the nose daily as needed for congestion.         . ranitidine (ZANTAC) 150 MG capsule   Oral   Take 1 capsule (150 mg total) by mouth daily.   30 capsule   0   . amoxicillin (AMOXIL) 500 MG capsule   Oral   Take 1 capsule (500 mg total) by mouth 3 (three) times daily.   21 capsule   0    BP 123/65  Pulse 90  Temp(Src) 97.6 F (36.4 C) (Oral)  Resp 16  Ht 6\' 2"  (1.88 m)  Wt 284  lb (128.822 kg)  BMI 36.45 kg/m2  SpO2 98% Physical Exam  Constitutional: He is oriented to person, place, and time. He appears well-developed.  HENT:  Head: Normocephalic.  Tender maxillary sinuses  Eyes: Conjunctivae and EOM are normal. No scleral icterus.  Neck: Neck supple. No thyromegaly present.  Cardiovascular: Normal rate and regular rhythm.  Exam reveals no gallop and no friction rub.   No murmur heard. Pulmonary/Chest: No stridor. He has no wheezes. He has no rales. He exhibits no tenderness.  Abdominal: He exhibits no distension. There is no tenderness. There is no rebound.  Musculoskeletal: Normal range of motion. He exhibits no edema.  Lymphadenopathy:    He has no cervical adenopathy.  Neurological: He is oriented to person, place, and time. He exhibits normal muscle tone. Coordination normal.  Skin: No rash noted. No erythema.  Psychiatric: He has a normal mood and affect. His behavior is  normal.    ED Course  Procedures (including critical care time) Labs Review Labs Reviewed - No data to display Imaging Review Dg Chest 2 View  09/03/2013   CLINICAL DATA:  Chills, cough, weakness and body aches.  EXAM: CHEST - 2 VIEW  COMPARISON:  08/03/2013  FINDINGS: The heart size and mediastinal contours are within normal limits. Lung volumes are low. There is no evidence of pulmonary edema, consolidation, pneumothorax, nodule or pleural fluid. The visualized skeletal structures are unremarkable.  IMPRESSION: No active disease.   Electronically Signed   By: Irish Lack M.D.   On: 09/03/2013 20:05    MDM   1. Bronchitis   2. Sinusitis        Benny Lennert, MD 09/03/13 2111

## 2013-09-03 NOTE — ED Notes (Signed)
I think I have the flu, I have chills, real dry cough, feel weak, body aches, and my head is stopped up per pt.

## 2013-09-27 ENCOUNTER — Encounter (HOSPITAL_COMMUNITY): Payer: Self-pay | Admitting: Emergency Medicine

## 2013-09-27 ENCOUNTER — Emergency Department (HOSPITAL_COMMUNITY)
Admission: EM | Admit: 2013-09-27 | Discharge: 2013-09-27 | Disposition: A | Discharge status: Home / Self Care | Payer: PRIVATE HEALTH INSURANCE | Attending: Emergency Medicine | Admitting: Emergency Medicine

## 2013-09-27 DIAGNOSIS — Z79899 Other long term (current) drug therapy: Secondary | ICD-10-CM | POA: Insufficient documentation

## 2013-09-27 DIAGNOSIS — E7800 Pure hypercholesterolemia, unspecified: Secondary | ICD-10-CM | POA: Insufficient documentation

## 2013-09-27 DIAGNOSIS — Z87891 Personal history of nicotine dependence: Secondary | ICD-10-CM | POA: Insufficient documentation

## 2013-09-27 DIAGNOSIS — Z792 Long term (current) use of antibiotics: Secondary | ICD-10-CM | POA: Insufficient documentation

## 2013-09-27 DIAGNOSIS — M109 Gout, unspecified: Secondary | ICD-10-CM | POA: Insufficient documentation

## 2013-09-27 DIAGNOSIS — I10 Essential (primary) hypertension: Secondary | ICD-10-CM | POA: Insufficient documentation

## 2013-09-27 DIAGNOSIS — Z760 Encounter for issue of repeat prescription: Secondary | ICD-10-CM | POA: Insufficient documentation

## 2013-09-27 MED ORDER — PREDNISONE 50 MG PO TABS
60.0000 mg | ORAL_TABLET | Freq: Once | ORAL | Status: AC
  Administered 2013-09-27: 23:00:00 60 mg via ORAL
  Filled 2013-09-27 (×2): qty 1

## 2013-09-27 MED ORDER — LISINOPRIL 20 MG PO TABS: 20.0000 mg | ORAL_TABLET | Freq: Every day | ORAL | Status: DC

## 2013-09-27 MED ORDER — OXYCODONE-ACETAMINOPHEN 5-325 MG PO TABS: 1.0000 | ORAL_TABLET | ORAL | Status: DC | PRN

## 2013-09-27 MED ORDER — PREDNISONE 10 MG PO TABS: ORAL_TABLET | ORAL | Status: DC

## 2013-09-27 MED ORDER — OXYCODONE-ACETAMINOPHEN 5-325 MG PO TABS
1.0000 | ORAL_TABLET | Freq: Once | ORAL | Status: AC
  Administered 2013-09-27: 1 via ORAL
  Filled 2013-09-27: qty 1

## 2013-09-27 NOTE — ED Notes (Signed)
Pt c/o left foot pain since last night and denies any injury. Pt has hx of gout.

## 2013-09-28 NOTE — ED Provider Notes (Signed)
CSN: [id number]     Arrival date & time 09/27/13  2223 History   First MD Initiated Contact with Patient 09/27/13 2244     Chief Complaint  Patient presents with  . Foot Pain   (Consider location/radiation/quality/duration/timing/severity/associated sxs/prior Treatment) Patient is a 47 y.o. male presenting with lower extremity pain. The history is provided by the patient.  Foot Pain This is a recurrent problem. The current episode started yesterday. The problem occurs constantly. The problem has been gradually worsening. Associated symptoms include arthralgias. Pertinent negatives include no chills, fever, joint swelling, nausea, neck pain, numbness, rash, visual change, vomiting or weakness. The symptoms are aggravated by walking and standing. He has tried nothing for the symptoms. The treatment provided no relief.   patinet c/o pain to left foot and great toe.  States he has hx of gout and pain feels similar to previous gout attacks.  He denies fever, open wounds, redness or red streaks to his foot.    Past Medical History  Diagnosis Date  . Hypertension   . Gout   . Gout   . Hypercholesteremia    History reviewed. No pertinent past surgical history. Family History  Problem Relation Age of Onset  . Hypertension Mother   . Stroke Father   . Stroke Brother   . Osteoarthritis Brother    History  Substance Use Topics  . Smoking status: Former Smoker    Types: Cigars  . Smokeless tobacco: Not on file  . Alcohol Use: No    Review of Systems  Constitutional: Negative for fever and chills.  Gastrointestinal: Negative for nausea and vomiting.  Genitourinary: Negative for dysuria and difficulty urinating.  Musculoskeletal: Positive for arthralgias. Negative for joint swelling and neck pain.  Skin: Negative for color change, rash and wound.  Neurological: Negative for weakness and numbness.  All other systems reviewed and are negative.    Allergies  Review of patient's  allergies indicates no known allergies.  Home Medications   Current Outpatient Rx  Name  Route  Sig  Dispense  Refill  . amoxicillin (AMOXIL) 500 MG capsule   Oral   Take 1 capsule (500 mg total) by mouth 3 (three) times daily.   21 capsule   0   . Ascorbic Acid (VITAMIN C PO)   Oral   Take 1 tablet by mouth once.         . fish oil-omega-3 fatty acids 1000 MG capsule   Oral   Take 1 g by mouth daily.         . Ginkgo Biloba 40 MG TABS   Oral   Take 1 tablet by mouth daily.         Marland Kitchen ibuprofen (ADVIL,MOTRIN) 200 MG tablet   Oral   Take 800 mg by mouth every 6 (six) hours as needed. Pain         . lisinopril (PRINIVIL,ZESTRIL) 20 MG tablet   Oral   Take 1 tablet (20 mg total) by mouth daily.   30 tablet   0   . lisinopril (PRINIVIL,ZESTRIL) 20 MG tablet   Oral   Take 1 tablet (20 mg total) by mouth daily.   15 tablet   0   . Multiple Vitamin (ONE-A-DAY MENS PO)   Oral   Take 1 tablet by mouth daily.           Marland Kitchen oxyCODONE-acetaminophen (PERCOCET/ROXICET) 5-325 MG per tablet   Oral   Take 1 tablet by mouth every 4 (four)  hours as needed for pain.   8 tablet   0   . oxymetazoline (AFRIN) 0.05 % nasal spray   Nasal   Place 2 sprays into the nose daily as needed for congestion.         . predniSONE (DELTASONE) 10 MG tablet      Take 6 tablets day one, 5 tablets day two, 4 tablets day three, 3 tablets day four, 2 tablets day five, then 1 tablet day six   21 tablet   0   . ranitidine (ZANTAC) 150 MG capsule   Oral   Take 1 capsule (150 mg total) by mouth daily.   30 capsule   0   . traMADol (ULTRAM) 50 MG tablet   Oral   Take 1 tablet (50 mg total) by mouth every 6 (six) hours as needed for pain.   15 tablet   0    BP 164/108  Pulse 77  Temp(Src) 98.6 F (37 C) (Oral)  Resp 18  Ht 6\' 2"  (1.88 m)  Wt 295 lb (133.811 kg)  BMI 37.86 kg/m2  SpO2 99% Physical Exam  Nursing note and vitals reviewed. Constitutional: He is oriented to  person, place, and time. He appears well-developed and well-nourished. No distress.  HENT:  Head: Normocephalic and atraumatic.  Cardiovascular: Normal rate, regular rhythm and normal heart sounds.   No murmur heard. Pulmonary/Chest: Effort normal and breath sounds normal. No respiratory distress.  Musculoskeletal: He exhibits tenderness. He exhibits no edema.       Left foot: He exhibits tenderness. He exhibits normal range of motion, no bony tenderness, no swelling, normal capillary refill, no crepitus, no deformity and no laceration.  Localized ttp of the left great toe and plantar foot.  No edema, erythema, excessive warmth or open wounds.  DP pulse brisk, distal sensation intact,  No proximal tenderness or edema.  Neurological: He is alert and oriented to person, place, and time. He exhibits normal muscle tone. Coordination normal.  Skin: Skin is warm and dry. No erythema.    ED Course  Procedures (including critical care time) Labs Review Labs Reviewed - No data to display Imaging Review No results found.  EKG Interpretation   None       MDM   1. Gout flare   2. Medication refill     Pt with gradually worsening pain to left great toe and foot, hx of gout and pain feel similar to previous gout flares.  Pt also hypertensive this evening and states he has not taken his BP medication today. Denies symptoms.   States he only has 1-2 tablets remaining and requests refill until he can f/u with his PMD.    Pt is well appearing and stable for discharge.     Wylie Coon L. Trisha Mangle, PA-C 09/28/13 2242

## 2013-10-01 NOTE — ED Provider Notes (Signed)
Medical screening examination/treatment/procedure(s) were performed by non-physician practitioner and as supervising physician I was immediately available for consultation/collaboration.  EKG Interpretation   None         Dagmar Hait, MD 10/01/13 [phone]

## 2013-12-20 ENCOUNTER — Emergency Department (HOSPITAL_COMMUNITY): Payer: BC Managed Care – PPO

## 2013-12-20 ENCOUNTER — Emergency Department (HOSPITAL_COMMUNITY)
Admission: EM | Admit: 2013-12-20 | Discharge: 2013-12-20 | Disposition: A | Discharge status: Home / Self Care | Payer: BC Managed Care – PPO | Attending: Emergency Medicine | Admitting: Emergency Medicine

## 2013-12-20 ENCOUNTER — Encounter (HOSPITAL_COMMUNITY): Payer: Self-pay | Admitting: Emergency Medicine

## 2013-12-20 DIAGNOSIS — B9789 Other viral agents as the cause of diseases classified elsewhere: Secondary | ICD-10-CM | POA: Insufficient documentation

## 2013-12-20 DIAGNOSIS — R112 Nausea with vomiting, unspecified: Secondary | ICD-10-CM | POA: Insufficient documentation

## 2013-12-20 DIAGNOSIS — R51 Headache: Secondary | ICD-10-CM | POA: Insufficient documentation

## 2013-12-20 DIAGNOSIS — R059 Cough, unspecified: Secondary | ICD-10-CM | POA: Insufficient documentation

## 2013-12-20 DIAGNOSIS — R05 Cough: Secondary | ICD-10-CM | POA: Insufficient documentation

## 2013-12-20 DIAGNOSIS — B349 Viral infection, unspecified: Secondary | ICD-10-CM

## 2013-12-20 DIAGNOSIS — M109 Gout, unspecified: Secondary | ICD-10-CM

## 2013-12-20 DIAGNOSIS — R0789 Other chest pain: Secondary | ICD-10-CM | POA: Insufficient documentation

## 2013-12-20 DIAGNOSIS — I10 Essential (primary) hypertension: Secondary | ICD-10-CM | POA: Insufficient documentation

## 2013-12-20 DIAGNOSIS — Z79899 Other long term (current) drug therapy: Secondary | ICD-10-CM | POA: Insufficient documentation

## 2013-12-20 DIAGNOSIS — R42 Dizziness and giddiness: Secondary | ICD-10-CM | POA: Insufficient documentation

## 2013-12-20 DIAGNOSIS — F17200 Nicotine dependence, unspecified, uncomplicated: Secondary | ICD-10-CM | POA: Insufficient documentation

## 2013-12-20 DIAGNOSIS — J3489 Other specified disorders of nose and nasal sinuses: Secondary | ICD-10-CM | POA: Insufficient documentation

## 2013-12-20 DIAGNOSIS — IMO0001 Reserved for inherently not codable concepts without codable children: Secondary | ICD-10-CM | POA: Insufficient documentation

## 2013-12-20 LAB — CBC WITH DIFFERENTIAL/PLATELET
Basophils Absolute: 0 10*3/uL (ref 0.0–0.1)
Basophils Relative: 0 % (ref 0–1)
EOS ABS: 0.2 10*3/uL (ref 0.0–0.7)
Eosinophils Relative: 2 % (ref 0–5)
HCT: 44.8 % (ref 39.0–52.0)
HEMOGLOBIN: 15.4 g/dL (ref 13.0–17.0)
LYMPHS ABS: 0.8 10*3/uL (ref 0.7–4.0)
LYMPHS PCT: 12 % (ref 12–46)
MCH: 27.9 pg (ref 26.0–34.0)
MCHC: 34.4 g/dL (ref 30.0–36.0)
MCV: 81.2 fL (ref 78.0–100.0)
MONOS PCT: 5 % (ref 3–12)
Monocytes Absolute: 0.4 10*3/uL (ref 0.1–1.0)
NEUTROS ABS: 5.8 10*3/uL (ref 1.7–7.7)
NEUTROS PCT: 81 % — AB (ref 43–77)
Platelets: 206 10*3/uL (ref 150–400)
RBC: 5.52 MIL/uL (ref 4.22–5.81)
RDW: 13.8 % (ref 11.5–15.5)
WBC: 7.2 10*3/uL (ref 4.0–10.5)

## 2013-12-20 LAB — COMPREHENSIVE METABOLIC PANEL
ALK PHOS: 84 U/L (ref 39–117)
ALT: 21 U/L (ref 0–53)
AST: 22 U/L (ref 0–37)
Albumin: 3.9 g/dL (ref 3.5–5.2)
BILIRUBIN TOTAL: 0.7 mg/dL (ref 0.3–1.2)
BUN: 17 mg/dL (ref 6–23)
CHLORIDE: 104 meq/L (ref 96–112)
CO2: 29 meq/L (ref 19–32)
Calcium: 9.3 mg/dL (ref 8.4–10.5)
Creatinine, Ser: 1.1 mg/dL (ref 0.50–1.35)
GFR, EST NON AFRICAN AMERICAN: 78 mL/min — AB (ref >90)
GLUCOSE: 99 mg/dL (ref 70–99)
POTASSIUM: 4.7 meq/L (ref 3.7–5.3)
Sodium: 142 mEq/L (ref 137–147)
Total Protein: 7 g/dL (ref 6.0–8.3)

## 2013-12-20 LAB — TROPONIN I: Troponin I: <0.3 ng/mL (ref <0.30)

## 2013-12-20 MED ORDER — PREDNISONE 20 MG PO TABS: ORAL_TABLET | ORAL | Status: DC

## 2013-12-20 MED ORDER — SODIUM CHLORIDE 0.9 % IV BOLUS (SEPSIS)
1000.0000 mL | Freq: Once | INTRAVENOUS | Status: AC
  Administered 2013-12-20: 1000 mL via INTRAVENOUS

## 2013-12-20 MED ORDER — COLCHICINE 0.6 MG PO TABS
1.2000 mg | ORAL_TABLET | Freq: Once | ORAL | Status: AC
  Administered 2013-12-20: 1.2 mg via ORAL
  Filled 2013-12-20: qty 2

## 2013-12-20 MED ORDER — OXYCODONE-ACETAMINOPHEN 5-325 MG PO TABS
1.0000 | ORAL_TABLET | Freq: Once | ORAL | Status: AC
  Administered 2013-12-20: 1 via ORAL
  Filled 2013-12-20: qty 1

## 2013-12-20 NOTE — ED Notes (Signed)
Patient c/o cough, sinus pressure, headache, generalized pain, chills, nausea, vomiting, and diarrhea. Patient reports taking thermaflu with no relief. Patient also c/o gout in left foot. Per patient left foot throbbing.

## 2013-12-20 NOTE — ED Provider Notes (Signed)
CSN: [id number]     Arrival date & time 12/20/13  4098 History   First MD Initiated Contact with Patient 12/20/13 1004     Chief Complaint  Patient presents with  . Gout  . Fever  . Emesis   (Consider location/radiation/quality/duration/timing/severity/associated sxs/prior Treatment) The history is provided by the patient. No language interpreter was used.  ROMIE TAY is a 48 year old male with past medical history of hypertension, gout, hypercholesterolemia presenting to emergency department with gout attack. Patient reported that the gout is localized to his left pinky toe that started approximately 2 days ago-described the discomfort as a constant throbbing sensation-pain worsens with motion-reported that most of his gout attacks occur in this region. Patient reported that he has been experiencing generalized bodyaches, fever, nausea, cough, nasal congestion, headache that started yesterday. Patient reports that he's been coughing up yellowish phlegm. Reported that the headache is a gradual onset is described as an aching, throbbing sensation. Patient reported that his body ache is localized to his back - described as an aching sensation. Patient reports he's been having discomfort when breathing, state when he coughs and with deep inhalation there is described as a squeezing sensation. Denied vomiting, diarrhea, melena, hematochezia, chest pain, shortness of breath, abdominal pain, neck pain, neck stiffness. PCP Dr. Phillips Odor  Past Medical History  Diagnosis Date  . Hypertension   . Gout   . Gout   . Hypercholesteremia    History reviewed. No pertinent past surgical history. Family History  Problem Relation Age of Onset  . Hypertension Mother   . Stroke Father   . Stroke Brother   . Osteoarthritis Brother    History  Substance Use Topics  . Smoking status: Current Some Day Smoker -- 32 years    Types: Cigars  . Smokeless tobacco: Never Used  . Alcohol Use: No    Review of  Systems  Constitutional: Negative for fever and chills.  HENT: Negative for sore throat and trouble swallowing.   Respiratory: Positive for chest tightness. Negative for shortness of breath.   Cardiovascular: Negative for chest pain.  Gastrointestinal: Positive for nausea. Negative for vomiting, abdominal pain, diarrhea, constipation and blood in stool.  Musculoskeletal: Positive for myalgias (Generalized). Negative for back pain, neck pain and neck stiffness.  Neurological: Positive for dizziness. Negative for weakness and numbness.  All other systems reviewed and are negative.    Allergies  Review of patient's allergies indicates no known allergies.  Home Medications   Current Outpatient Rx  Name  Route  Sig  Dispense  Refill  . Ascorbic Acid (VITAMIN C PO)   Oral   Take 1 tablet by mouth daily.          . fish oil-omega-3 fatty acids 1000 MG capsule   Oral   Take 1 g by mouth daily.         . Ginkgo Biloba 40 MG TABS   Oral   Take 1 tablet by mouth daily.         Marland Kitchen ibuprofen (ADVIL,MOTRIN) 200 MG tablet   Oral   Take 800 mg by mouth every 6 (six) hours as needed. Pain         . lisinopril (PRINIVIL,ZESTRIL) 20 MG tablet   Oral   Take 1 tablet (20 mg total) by mouth daily.   30 tablet   0   . Multiple Vitamin (ONE-A-DAY MENS PO)   Oral   Take 1 tablet by mouth daily.           Marland Kitchen  oxyCODONE-acetaminophen (PERCOCET/ROXICET) 5-325 MG per tablet   Oral   Take 1 tablet by mouth every 4 (four) hours as needed for pain.   8 tablet   0   . Phenylephrine-Pheniramine-DM (THERAFLU COLD & COUGH) 09-20-19 MG PACK   Oral   Take 1 packet by mouth daily as needed (cold/flu symptoms.).         Marland Kitchen oxymetazoline (AFRIN) 0.05 % nasal spray   Nasal   Place 2 sprays into the nose daily as needed for congestion.         . predniSONE (DELTASONE) 20 MG tablet      3 tabs po day one, then 2 tabs daily x 4 days   11 tablet   0    BP 158/95  Pulse 77  Temp(Src)  99.6 F (37.6 C) (Oral)  Resp 18  Ht 6\' 2"  (1.88 m)  Wt 285 lb (129.275 kg)  BMI 36.58 kg/m2  SpO2 98% Physical Exam  Nursing note and vitals reviewed. Constitutional: He is oriented to person, place, and time. He appears well-developed and well-nourished. No distress.  HENT:  Head: Normocephalic and atraumatic.  Mouth/Throat: Oropharynx is clear and moist. No oropharyngeal exudate.  Eyes: Conjunctivae and EOM are normal. Pupils are equal, round, and reactive to light. Right eye exhibits no discharge. Left eye exhibits no discharge.  Neck: Normal range of motion. Neck supple. No tracheal deviation present.  Negative neck stiffness Negative nuchal rigidity Negative cervical lymphadenopathy Negative meningeal signs  Cardiovascular: Normal rate, regular rhythm and normal heart sounds.  Exam reveals no friction rub.   No murmur heard. Pulses:      Radial pulses are 2+ on the right side, and 2+ on the left side.       Dorsalis pedis pulses are 2+ on the right side, and 2+ on the left side.  Pulmonary/Chest: Effort normal and breath sounds normal. No respiratory distress. He has no wheezes. He has no rales.  Abdominal:  Obese  Musculoskeletal: Normal range of motion. He exhibits tenderness.       Feet:  Full ROM to upper and lower extremities without difficulty noted, negative ataxia noted.  Decreased range of motion to the left pinky toe secondary to pain.  Lymphadenopathy:    He has no cervical adenopathy.  Neurological: He is alert and oriented to person, place, and time. No cranial nerve deficit. He exhibits normal muscle tone. Coordination normal.  Strength 5+/5+ to upper and lower extremities bilaterally with resistance applied, equal distribution noted Strength intact to digits of the left foot Sensation intact Gait proper, proper balance - negative sway, negative drift, negative step-offs  Skin: He is not diaphoretic. There is erythema.  Swelling, erythema, inflammation,  warmth upon palpation to the base of the left pinky toe-localized to the plantar aspect of the foot. Negative red streaks. Negative drainage. Tenderness upon superficial palpation.   Psychiatric: He has a normal mood and affect. His behavior is normal. Thought content normal.    ED Course  Procedures (including critical care time)  1:47 PM This provider re-assessed the patient. Patient reported that he is feeling better. Denied chest pain, shortness of breath, difficulty breathing.   Results for orders placed during the hospital encounter of 12/20/13  CBC WITH DIFFERENTIAL      Result Value Range   WBC 7.2  4.0 - 10.5 K/uL   RBC 5.52  4.22 - 5.81 MIL/uL   Hemoglobin 15.4  13.0 - 17.0 g/dL   HCT 44.8  39.0 - 52.0 %   MCV 81.2  78.0 - 100.0 fL   MCH 27.9  26.0 - 34.0 pg   MCHC 34.4  30.0 - 36.0 g/dL   RDW 25.313.8  66.411.5 - 40.315.5 %   Platelets 206  150 - 400 K/uL   Neutrophils Relative % 81 (*) 43 - 77 %   Neutro Abs 5.8  1.7 - 7.7 K/uL   Lymphocytes Relative 12  12 - 46 %   Lymphs Abs 0.8  0.7 - 4.0 K/uL   Monocytes Relative 5  3 - 12 %   Monocytes Absolute 0.4  0.1 - 1.0 K/uL   Eosinophils Relative 2  0 - 5 %   Eosinophils Absolute 0.2  0.0 - 0.7 K/uL   Basophils Relative 0  0 - 1 %   Basophils Absolute 0.0  0.0 - 0.1 K/uL  COMPREHENSIVE METABOLIC PANEL      Result Value Range   Sodium 142  137 - 147 mEq/L   Potassium 4.7  3.7 - 5.3 mEq/L   Chloride 104  96 - 112 mEq/L   CO2 29  19 - 32 mEq/L   Glucose, Bld 99  70 - 99 mg/dL   BUN 17  6 - 23 mg/dL   Creatinine, Ser 4.741.10  0.50 - 1.35 mg/dL   Calcium 9.3  8.4 - 25.910.5 mg/dL   Total Protein 7.0  6.0 - 8.3 g/dL   Albumin 3.9  3.5 - 5.2 g/dL   AST 22  0 - 37 U/L   ALT 21  0 - 53 U/L   Alkaline Phosphatase 84  39 - 117 U/L   Total Bilirubin 0.7  0.3 - 1.2 mg/dL   GFR calc non Af Amer 78 (*) >90 mL/min   GFR calc Af Amer >90  >90 mL/min  TROPONIN I      Result Value Range   Troponin I <0.30  <0.30 ng/mL  TROPONIN I      Result  Value Range   Troponin I <0.30  <0.30 ng/mL    Dg Chest 2 View  12/20/2013   CLINICAL DATA:  Cough and fever  EXAM: CHEST  2 VIEW  COMPARISON:  September 03, 2013  FINDINGS: Lungs are clear. The heart size and pulmonary vascularity are normal. No adenopathy. No pneumothorax. No bone lesions.  IMPRESSION: No abnormality noted.   Electronically Signed   By: Bretta BangWilliam  Woodruff M.D.   On: 12/20/2013 11:40   Dg Foot Complete Left  12/20/2013   CLINICAL DATA:  Pain  EXAM: LEFT FOOT - COMPLETE 3+ VIEW  COMPARISON:  November 30, 2012  FINDINGS: Frontal, oblique, and lateral views were obtained. There are erosions along the proximal third and fourth metatarsals, a stable finding. There is no new erosive change on this study. There is spurring in the dorsal midfoot region. Other joint spaces appear intact. No fracture or dislocation. There is a spur arising from the inferior calcaneus.  IMPRESSION: Erosions in the proximal third and fourth metatarsal regions are stable. These findings are concerning for gout. 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 No fracture or dislocation. Inferior calcaneal spur, stable. No appreciable change from prior study.   Electronically Signed   By: Bretta BangWilliam  Woodruff M.D.   On: 12/20/2013 11:39    Labs Review Labs Reviewed  CBC WITH DIFFERENTIAL - Abnormal; Notable for the following:    Neutrophils Relative % 81 (*)    All other components within normal  limits  COMPREHENSIVE METABOLIC PANEL - Abnormal; Notable for the following:    GFR calc non Af Amer 78 (*)    All other components within normal limits  TROPONIN I  TROPONIN I   Imaging Review Dg Chest 2 View  12/20/2013   CLINICAL DATA:  Cough and fever  EXAM: CHEST  2 VIEW  COMPARISON:  September 03, 2013  FINDINGS: Lungs are clear. The heart size and pulmonary vascularity are normal. No adenopathy. No pneumothorax. No bone lesions.  IMPRESSION: No abnormality noted.   Electronically Signed   By:  Bretta Bang M.D.   On: 12/20/2013 11:40   Dg Foot Complete Left  12/20/2013   CLINICAL DATA:  Pain  EXAM: LEFT FOOT - COMPLETE 3+ VIEW  COMPARISON:  November 30, 2012  FINDINGS: Frontal, oblique, and lateral views were obtained. There are erosions along the proximal third and fourth metatarsals, a stable finding. There is no new erosive change on this study. There is spurring in the dorsal midfoot region. Other joint spaces appear intact. No fracture or dislocation. There is a spur arising from the inferior calcaneus.  IMPRESSION: Erosions in the proximal third and fourth metatarsal regions are stable. These findings are concerning for gout. 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 No fracture or dislocation. Inferior calcaneal spur, stable. No appreciable change from prior study.   Electronically Signed   By: Bretta Bang M.D.   On: 12/20/2013 11:39    EKG Interpretation    Date/Time:  Tuesday December 20 2013 10:29:36 EST Ventricular Rate:  91 PR Interval:  174 QRS Duration: 104 QT Interval:  352 QTC Calculation: 432 R Axis:   57 Text Interpretation:  Normal sinus rhythm Incomplete right bundle branch block Nonspecific T wave abnormality Abnormal ECG When compared with ECG of 03-Aug-2013 07:35, Nonspecific T wave abnormality now evident in Lateral leads Confirmed by Bebe Shaggy  MD, DONALD [phone]) on 12/20/2013 10:43:11 AM            MDM   1. Gout attack   2. Viral syndrome    Medications  colchicine tablet 1.2 mg (1.2 mg Oral Given 12/20/13 1113)  sodium chloride 0.9 % bolus 1,000 mL (0 mLs Intravenous Stopped 12/20/13 1212)  oxyCODONE-acetaminophen (PERCOCET/ROXICET) 5-325 MG per tablet 1 tablet (1 tablet Oral Given 12/20/13 1240)   Filed Vitals:   12/20/13 1002 12/20/13 1200  BP: 151/100 158/95  Pulse: 99 77  Temp: 99.6 F (37.6 C)   TempSrc: Oral   Resp: 18   Height: 6\' 2"  (1.88 m)   Weight: 285 lb (129.275 kg)   SpO2: 98%      Patient presenting to emergency department with gout attack localized to the left pinky toe that started approximately 2 days ago. Patient reported that he's been experiencing cough that is productive of white yellowish phlegm, nasal congestion, headache, dizziness, nausea, feverish feeling, headache is described as a dull aching sensation that started yesterday. Alert and oriented. GCS 15. Heart rate and rhythm normal. Lungs clear to auscultation. Radial and DP pulses 2+ bilaterally. Full range of motion to upper lower extremities bilaterally. Nasal congestion identified. Unremarkable oral exam. Mild swelling, inflammation and warmth upon palpation to the dorsal aspects of the left pinky toe-discomfort upon palpation-pain with motion. Strength intact to the digits of the left foot. Sensation intact. EKG noted normal sinus rhythm of 91 beats per minute with an incomplete right bundle branch block, nonspecific T waves-negative new findings on EKG, negative ischemic  findings noted. First troponin negative elevation. Second troponin negative elevation. CBC negative findings. CMP negative findings. Chest x-ray negative findings. Left foot noted erosions the proximal third and fourth metatarsal regions that are stable-findings are consistent with gout, osteoarthritic changes identified-no acute abnormalities identified. Doubt pneumonia. Doubt cardiac issue. Suspicion to be viral illness. Patient presenting with gout attack. Patient stable, afebrile. Discharged patient. Referred patient to primary care provider-discussed importance of discussing with primary care provider regarding maintenance therapy for gout prevent further attacks to occur. Discussed with patient to rest and stay hydrated. Discussed with patient to closely monitor symptoms and if symptoms are to worsen or change to report back to the ED - strict return instructions given.  Patient agreed to plan of care, understood, all questions answered.    Raymon Mutton, PA-C 12/21/13 2005

## 2013-12-20 NOTE — Discharge Instructions (Signed)
Please call your doctor for a followup appointment within 24-48 hours. When you talk to your doctor please let them know that you were seen in the emergency department and have them acquire all of your records so that they can discuss the findings with you and formulate a treatment plan to fully care for your new and ongoing problems. Please rest and stay hydrated Please avoid any physical and strenuous activity Please see your physician to be re-assessed - will need to discuss a plan for maintenance therapy regarding continuous gout attacks Please continue to monitor symptoms closely and if symptoms are to worsen or change (fever greater than 101, chills, sweating, neck pain, neck stiffness, chest pain, shortness of breath, difficulty breathing, stomach pain) please report back to the ED immediately  Viral Infections A virus is a type of germ. Viruses can cause:  Minor sore throats.  Aches and pains.  Headaches.  Runny nose.  Rashes.  Watery eyes.  Tiredness.  Coughs.  Loss of appetite.  Feeling sick to your stomach (nausea).  Throwing up (vomiting).  Watery poop (diarrhea). HOME CARE   Only take medicines as told by your doctor.  Drink enough water and fluids to keep your pee (urine) clear or pale yellow. Sports drinks are a good choice.  Get plenty of rest and eat healthy. Soups and broths with crackers or rice are fine. GET HELP RIGHT AWAY IF:   You have a very bad headache.  You have shortness of breath.  You have chest pain or neck pain.  You have an unusual rash.  You cannot stop throwing up.  You have watery poop that does not stop.  You cannot keep fluids down.  You or your child has a temperature by mouth above 102 F (38.9 C), not controlled by medicine.  Your baby is older than 3 months with a rectal temperature of 102 F (38.9 C) or higher.  Your baby is 62 months old or younger with a rectal temperature of 100.4 F (38 C) or higher. MAKE  SURE YOU:   Understand these instructions.  Will watch this condition.  Will get help right away if you are not doing well or get worse. Document Released: 10/30/2008 Document Revised: 02/09/2012 Document Reviewed: 03/25/2011 [facility] Patient Information 2014 [place], [place]. Gout Gout is an inflammatory arthritis caused by a buildup of uric acid crystals in the joints. Uric acid is a chemical that is normally present in the blood. When the level of uric acid in the blood is too high it can form crystals that deposit in your joints and tissues. This causes joint redness, soreness, and swelling (inflammation). Repeat attacks are common. Over time, uric acid crystals can form into masses (tophi) near a joint, destroying bone and causing disfigurement. Gout is treatable and often preventable. CAUSES  The disease begins with elevated levels of uric acid in the blood. Uric acid is produced by your body when it breaks down a naturally found substance called purines. Certain foods you eat, such as meats and fish, contain high amounts of purines. Causes of an elevated uric acid level include:  Being passed down from parent to child (heredity).  Diseases that cause increased uric acid production (such as obesity, psoriasis, and certain cancers).  Excessive alcohol use.  Diet, especially diets rich in meat and seafood.  Medicines, including certain cancer-fighting medicines (chemotherapy), water pills (diuretics), and aspirin.  Chronic kidney disease. The kidneys are no longer able to remove uric acid well.  Problems  with metabolism. Conditions strongly associated with gout include:  Obesity.  High blood pressure.  High cholesterol.  Diabetes. Not everyone with elevated uric acid levels gets gout. It is not understood why some people get gout and others do not. Surgery, joint injury, and eating too much of certain foods are some of the factors that can lead to gout attacks. SYMPTOMS   An  attack of gout comes on quickly. It causes intense pain with redness, swelling, and warmth in a joint.  Fever can occur.  Often, only one joint is involved. Certain joints are more commonly involved:  Base of the big toe.  Knee.  Ankle.  Wrist.  Finger. Without treatment, an attack usually goes away in a few days to weeks. Between attacks, you usually will not have symptoms, which is different from many other forms of arthritis. DIAGNOSIS  Your caregiver will suspect gout based on your symptoms and exam. In some cases, tests may be recommended. The tests may include:  Blood tests.  Urine tests.  X-rays.  Joint fluid exam. This exam requires a needle to remove fluid from the joint (arthrocentesis). Using a microscope, gout is confirmed when uric acid crystals are seen in the joint fluid. TREATMENT  There are two phases to gout treatment: treating the sudden onset (acute) attack and preventing attacks (prophylaxis).  Treatment of an Acute Attack.  Medicines are used. These include anti-inflammatory medicines or steroid medicines.  An injection of steroid medicine into the affected joint is sometimes necessary.  The painful joint is rested. Movement can worsen the arthritis.  You may use warm or cold treatments on painful joints, depending which works best for you.  Treatment to Prevent Attacks.  If you suffer from frequent gout attacks, your caregiver may advise preventive medicine. These medicines are started after the acute attack subsides. These medicines either help your kidneys eliminate uric acid from your body or decrease your uric acid production. You may need to stay on these medicines for a very long time.  The early phase of treatment with preventive medicine can be associated with an increase in acute gout attacks. For this reason, during the first few months of treatment, your caregiver may also advise you to take medicines usually used for acute gout treatment.  Be sure you understand your caregiver's directions. Your caregiver may make several adjustments to your medicine dose before these medicines are effective.  Discuss dietary treatment with your caregiver or dietitian. Alcohol and drinks high in sugar and fructose and foods such as meat, poultry, and seafood can increase uric acid levels. Your caregiver or dietician can advise you on drinks and foods that should be limited. HOME CARE INSTRUCTIONS   Do not take aspirin to relieve pain. This raises uric acid levels.  Only take over-the-counter or prescription medicines for pain, discomfort, or fever as directed by your caregiver.  Rest the joint as much as possible. When in bed, keep sheets and blankets off painful areas.  Keep the affected joint raised (elevated).  Apply warm or cold treatments to painful joints. Use of warm or cold treatments depends on which works best for you.  Use crutches if the painful joint is in your leg.  Drink enough fluids to keep your urine clear or pale yellow. This helps your body get rid of uric acid. Limit alcohol, sugary drinks, and fructose drinks.  Follow your dietary instructions. Pay careful attention to the amount of protein you eat. Your daily diet should emphasize fruits, vegetables,  whole grains, and fat-free or low-fat milk products. Discuss the use of coffee, vitamin C, and cherries with your caregiver or dietician. These may be helpful in lowering uric acid levels.  Maintain a healthy body weight. SEEK MEDICAL CARE IF:   You develop diarrhea, vomiting, or any side effects from medicines.  You do not feel better in 24 hours, or you are getting worse. SEEK IMMEDIATE MEDICAL CARE IF:   Your joint becomes suddenly more tender, and you have chills or a fever. MAKE SURE YOU:   Understand these instructions.  Will watch your condition.  Will get help right away if you are not doing well or get worse. Document Released: 11/14/2000 Document  Revised: 03/14/2013 Document Reviewed: 06/30/2012 [facility] Patient Information 2014 [place], [place].   Emergency Department Resource Guide 1) Find a Doctor and Pay Out of Pocket Although you won't have to find out who is covered by your insurance plan, it is a good idea to ask around and get recommendations. You will then need to call the office and see if the doctor you have chosen will accept you as a new patient and what types of options they offer for patients who are self-pay. Some doctors offer discounts or will set up payment plans for their patients who do not have insurance, but you will need to ask so you aren't surprised when you get to your appointment.  2) Contact Your Local Health Department Not all health departments have doctors that can see patients for sick visits, but many do, so it is worth a call to see if yours does. If you don't know where your local health department is, you can check in your phone book. The CDC also has a tool to help you locate your state's health department, and many state websites also have listings of all of their local health departments.  3) Find a Walk-in Clinic If your illness is not likely to be very severe or complicated, you may want to try a walk in clinic. These are popping up all over the country in pharmacies, drugstores, and shopping centers. They're usually staffed by nurse practitioners or physician assistants that have been trained to treat common illnesses and complaints. They're usually fairly quick and inexpensive. However, if you have serious medical issues or chronic medical problems, these are probably not your best option.  No Primary Care Doctor: - Call Health Connect at  [phone] - they can help you locate a primary care doctor that  accepts your insurance, provides certain services, etc. - Physician Referral Service- [phone]  Chronic Pain Problems: Organization         Address  Phone   Notes  [facility]  [phone] Patients need to be referred by their primary care doctor.   Medication Assistance: Organization         Address  Phone   Notes  [facility] Medication [facility] [address] [place] [place], [place] [address] [phone] --Must be a resident of [facility] -- Must have NO insurance coverage whatsoever (no Medicaid/ Medicare, etc.) -- The pt. MUST have a primary care doctor that directs their care regularly and follows them in the community   MedAssist  [phone]   [employer]  [phone]    Agencies that provide inexpensive medical care: Organization         Address  Phone   Notes  [facility]  [phone]   Patrcia Dolly  [facility]    [phone]   [facility] [address] [place], [place] [address] [phone]   [facility] [address], [place] [phone]   Planned Parenthood    [phone]   [facility]    [phone]   [facility]  [address], [place] Phone:  [phone], Fax:  [phone] Hours of Operation:  9 am - 6 pm, M-F.  Also accepts Medicaid/Medicare and self-pay.  [facility]  [address], [place] Phone: [phone], Fax: [phone]. Hours of Operation:  8:30 am - 5:30 pm, M-F.  Also accepts Medicaid and self-pay.  [facility] [place] [address], [place] Phone: [phone]   [facility] [address] Natasha Bence [place], [place] [phone], Ext. [phone] Mondays & Thursdays: 7-9 AM.  First 15 patients are seen on a first come, first serve basis.    Medicaid-accepting [facility] Providers:  Organization         Address  Phone   Notes  [facility] [address], [place] [phone] Also accepts self-pay patients.  [facility] [address] Laurell Josephs [place],  [place]  [phone]   [facility] [address], [place] [phone]   [facility] [address] [place] [phone]   Renaye Rakers [address], [place]   [phone] Only accepts [place] Access [place] patients after they have their name applied to their card.   Self-Pay (no insurance) in [facility]:  Organization         Address  Phone   Notes  Sickle Cell Patients, [facility] [address] [place], [place] [phone]   [facility] [address] [place], [place] [phone]   [facility] [place]  [address], [place] [phone]   [facility]/Dr. Osei-Bonsu  [address] [place] or [address], [place] [phone] Phone number for both [place] and [place] locations is the same.  [facility] [address] [place] [phone]   [facility] [address] [place] or [address] [phone]   [facility] [address] [place] [phone], phone; [phone], fax Sees patients 1st and 3rd Saturday of every month.  Must not qualify for public or private insurance (i.e. Medicaid, Medicare, [place] Health Choice, Veterans' Benefits)  Household income should be no more than 200% of the poverty level The clinic cannot treat you if you are pregnant or think you are pregnant  Sexually transmitted diseases are not treated at the clinic.    Dental Care: Organization         Address  Phone  Notes  [facility] Department of [facility] [address] [place], [place] [phone] Accepts children up to age 12 who are enrolled in [place] or [place] Health Choice; pregnant women with a Medicaid card; and children who have applied for Medicaid or [place] Health  Choice, but were declined, whose parents can pay a reduced fee at time of service.  [facility] of [facility]  [address], [employer]  [place] [phone] Accepts children up to age 49 who are enrolled in Medicaid or [place] Health Choice; pregnant women with a Medicaid card; and children who have applied for Medicaid or [place] Health Choice, but were declined, whose parents can pay a reduced fee at time of service.  [facility]  [address], [place] [phone] Patients are seen by appointment only. Walk-ins are not accepted. [facility] will see patients 49 years of age and older. Monday - Tuesday (8am-5pm) Most Wednesdays (8:30-5pm) $30 per visit, cash only  [facility]  [address], [facility] [place] [phone] Patients are seen by appointment only. Walk-ins are not accepted. [facility] will see patients 49 years of age and older. One Wednesday Evening (Monthly: Volunteer Based).  $30 per visit, cash only  [employer]  [phone] for adults; Children under age 64, call Graduate Pediatric Dentistry at [phone]) [phone]. Children aged 694-14, please call [phone] to request a pediatric application.  Dental services are provided in all areas of dental care including fillings, crowns and bridges, complete and partial dentures, implants, gum treatment, root canals, and extractions. Preventive care is also provided. Treatment is provided to both adults and children. Patients are selected via a lottery and there is often a waiting list.   [facility] [address], [place]  [phone] [email or web address]   [facility] [address], [place], [place] [phone], Ext. [phone] Second and Fourth Thursday of each month, opens at 6:30 AM; Clinic ends at 9 AM.  Patients are seen on a first-come first-served basis, and a limited number are seen during each  clinic.   [facility]  [address], [place], [place] [phone]) [phone]   Eligibility Requirements You must have lived in [place], [place], or [place] counties for at least the last three months.   You cannot be eligible for state or federal sponsored [place]healthcare insurance, including CIGNAVeterans Administration, [place]icaid, or [employer]icare.   You generally cannot be eligible for healthcare insurance through your employer.    How to apply: Eligibility screenings are held every Tuesday and Wednesday afternoon from 1:00 pm until 4:00 pm. You do not need an appointment for the interview!  [facility] [address], [place], [place] [phone]   [facility]  [phone]   [facility]  [phone]   [facility]  [phone]    Behavioral Health Resources in the Community: Intensive Outpatient Programs Organization         Address  Phone  Notes  [facility] [address], [place], [place] [phone]   [facility] Outpatient [address], [place], [place] [phone]   [facility] [address], [place], [place]  [phone]   [facility] [address],  [place], [place] [phone] or [phone]   Substance Abuse Resources Organization         Address  Phone  Notes  [facility]  [phone]   [facility]  [phone]   [facility]  [phone]   [facility]  [phone]   Residential & Outpatient Substance Abuse Program  [phone]   Psychological Services Organization         Address  Phone  Notes  [facility]  [phone]- [phone]   [facility]  [phone]- [phone]  [facility] [address], [place] [phone] or [phone]    Mobile Crisis Teams Organization         Address  Phone  Notes  [facility], Mobile  Crisis Care Unit  [phone]   [facility]  [address] [place], [place] [phone]   Doristine Locks [address] [place] [phone]    Self-Help/Support Groups Organization         Address  Phone             Notes  [facility] [place] - variety of support groups  [phone]- [phone] Call for more information  Narcotics Anonymous (NA), [facility] [address], [employer] [place]  2 meetings at this location   Statistician         Address  Phone  Notes  [facility] [address],    [place]  [phone]   [facility]  [address], [place] [address], [place], [place] [phone]   [facility] [address] [place], [place] [phone] Admissions: 8am-3pm M-F  [facility] [address],    [place], [place] [phone]   [facility] [address] [place], [place], [place] [phone]   [facility] [address],  [place], [place] [phone]   [facility] - Intensive Outpatient [address] Joseph[address], [place], [place] [phone]   [facility] ([facility] [address] [place]  [place], [place] [phone] or [phone]   [facility] ([facility]) [address] [place], [place] [phone] Accepts Medicaid  [facility] [address],  [place] [phone] Substance Abuse/Addiction Treatment   [facility] Organization         Address  Phone  Notes  [facility]  [phone]   Angie Fava, PhD [address] Ervin Knack [place], [place]   [phone] or [phone]   [facility]al   [address] [place], [place] [phone]   [facility] [address], [place], [place] [phone] Insurance/Medicaid/sponsorship through [facility] and Families [address]                                    [place], [place] [phone] Therapy/tele-psych/case  [facility] [address], [place] [phone]    Dr. Lolly Mustache  [phone]   [facility]  [employer] [facility] Dept. 1) [address] [place] 2) [address], [place] 3)  [address], [place] [phone]  [phone]   [facility] Child Abuse Hotline [phone] or [phone] (After Hours)

## 2013-12-22 NOTE — ED Provider Notes (Signed)
Medical screening examination/treatment/procedure(s) were conducted as a shared visit with non-physician practitioner(s) and myself.  I personally evaluated the patient during the encounter.  EKG Interpretation    Date/Time:  Tuesday December 20 2013 10:29:36 EST Ventricular Rate:  91 PR Interval:  174 QRS Duration: 104 QT Interval:  352 QTC Calculation: 432 R Axis:   57 Text Interpretation:  Normal sinus rhythm Incomplete right bundle branch block Nonspecific T wave abnormality Abnormal ECG When compared with ECG of 03-Aug-2013 07:35, Nonspecific T wave abnormality now evident in Lateral leads Confirmed by Bebe ShaggyWICKLINE  MD, Paxon Propes [phone]([id number]) on 12/20/2013 10:43:11 AM            Pt well appearing, I have low suspicion for ACS at this time as cause of his chest tightness, stable for d/c home.  Joya Gaskinsonald W Tarig Zimmers, MD 12/22/13 1524

## 2014-01-20 ENCOUNTER — Encounter (HOSPITAL_COMMUNITY): Payer: Self-pay | Admitting: Emergency Medicine

## 2014-01-20 ENCOUNTER — Emergency Department (HOSPITAL_COMMUNITY)
Admission: EM | Admit: 2014-01-20 | Discharge: 2014-01-20 | Disposition: A | Discharge status: Home / Self Care | Payer: BC Managed Care – PPO | Attending: Emergency Medicine | Admitting: Emergency Medicine

## 2014-01-20 DIAGNOSIS — F17200 Nicotine dependence, unspecified, uncomplicated: Secondary | ICD-10-CM | POA: Insufficient documentation

## 2014-01-20 DIAGNOSIS — R197 Diarrhea, unspecified: Secondary | ICD-10-CM | POA: Insufficient documentation

## 2014-01-20 DIAGNOSIS — R112 Nausea with vomiting, unspecified: Secondary | ICD-10-CM | POA: Insufficient documentation

## 2014-01-20 DIAGNOSIS — R6883 Chills (without fever): Secondary | ICD-10-CM | POA: Insufficient documentation

## 2014-01-20 DIAGNOSIS — E7800 Pure hypercholesterolemia, unspecified: Secondary | ICD-10-CM | POA: Insufficient documentation

## 2014-01-20 DIAGNOSIS — Z79899 Other long term (current) drug therapy: Secondary | ICD-10-CM | POA: Insufficient documentation

## 2014-01-20 DIAGNOSIS — R109 Unspecified abdominal pain: Secondary | ICD-10-CM

## 2014-01-20 DIAGNOSIS — E663 Overweight: Secondary | ICD-10-CM | POA: Insufficient documentation

## 2014-01-20 DIAGNOSIS — I10 Essential (primary) hypertension: Secondary | ICD-10-CM | POA: Insufficient documentation

## 2014-01-20 DIAGNOSIS — M109 Gout, unspecified: Secondary | ICD-10-CM | POA: Insufficient documentation

## 2014-01-20 DIAGNOSIS — R1110 Vomiting, unspecified: Secondary | ICD-10-CM

## 2014-01-20 LAB — CBC WITH DIFFERENTIAL/PLATELET
BASOS PCT: 0 % (ref 0–1)
Basophils Absolute: 0 10*3/uL (ref 0.0–0.1)
EOS ABS: 0 10*3/uL (ref 0.0–0.7)
EOS PCT: 0 % (ref 0–5)
HCT: 48.4 % (ref 39.0–52.0)
Hemoglobin: 16.4 g/dL (ref 13.0–17.0)
Lymphocytes Relative: 4 % — ABNORMAL LOW (ref 12–46)
Lymphs Abs: 0.3 10*3/uL — ABNORMAL LOW (ref 0.7–4.0)
MCH: 27.8 pg (ref 26.0–34.0)
MCHC: 33.9 g/dL (ref 30.0–36.0)
MCV: 82.2 fL (ref 78.0–100.0)
Monocytes Absolute: 0.3 10*3/uL (ref 0.1–1.0)
Monocytes Relative: 4 % (ref 3–12)
NEUTROS PCT: 92 % — AB (ref 43–77)
Neutro Abs: 7.1 10*3/uL (ref 1.7–7.7)
PLATELETS: 229 10*3/uL (ref 150–400)
RBC: 5.89 MIL/uL — ABNORMAL HIGH (ref 4.22–5.81)
RDW: 14 % (ref 11.5–15.5)
WBC: 7.7 10*3/uL (ref 4.0–10.5)

## 2014-01-20 LAB — COMPREHENSIVE METABOLIC PANEL
ALK PHOS: 92 U/L (ref 39–117)
ALT: 31 U/L (ref 0–53)
AST: 25 U/L (ref 0–37)
Albumin: 4 g/dL (ref 3.5–5.2)
BILIRUBIN TOTAL: 0.9 mg/dL (ref 0.3–1.2)
BUN: 15 mg/dL (ref 6–23)
CO2: 25 mEq/L (ref 19–32)
Calcium: 9.4 mg/dL (ref 8.4–10.5)
Chloride: 106 mEq/L (ref 96–112)
Creatinine, Ser: 1.08 mg/dL (ref 0.50–1.35)
GFR, EST NON AFRICAN AMERICAN: 79 mL/min — AB (ref >90)
Glucose, Bld: 122 mg/dL — ABNORMAL HIGH (ref 70–99)
Potassium: 4.3 mEq/L (ref 3.7–5.3)
Sodium: 142 mEq/L (ref 137–147)
TOTAL PROTEIN: 7.6 g/dL (ref 6.0–8.3)

## 2014-01-20 LAB — LIPASE, BLOOD: LIPASE: 31 U/L (ref 11–59)

## 2014-01-20 MED ORDER — LOPERAMIDE HCL 2 MG PO CAPS: 2.0000 mg | ORAL_CAPSULE | Freq: Four times a day (QID) | ORAL | Status: DC | PRN

## 2014-01-20 MED ORDER — ONDANSETRON HCL 4 MG/2ML IJ SOLN: 4.0000 mg | Freq: Once | INTRAMUSCULAR | Status: DC

## 2014-01-20 MED ORDER — ONDANSETRON HCL 4 MG/2ML IJ SOLN
4.0000 mg | Freq: Once | INTRAMUSCULAR | Status: AC
  Administered 2014-01-20: 4 mg via INTRAVENOUS
  Filled 2014-01-20: qty 2

## 2014-01-20 MED ORDER — SODIUM CHLORIDE 0.9 % IV BOLUS (SEPSIS)
1000.0000 mL | Freq: Once | INTRAVENOUS | Status: AC
  Administered 2014-01-20: 1000 mL via INTRAVENOUS

## 2014-01-20 MED ORDER — ONDANSETRON 4 MG PO TBDP: 4.0000 mg | ORAL_TABLET | Freq: Three times a day (TID) | ORAL | Status: DC | PRN

## 2014-01-20 MED ORDER — LOPERAMIDE HCL 2 MG PO CAPS
4.0000 mg | ORAL_CAPSULE | Freq: Once | ORAL | Status: AC
  Administered 2014-01-20: 4 mg via ORAL
  Filled 2014-01-20: qty 2

## 2014-01-20 NOTE — Discharge Instructions (Signed)
Viral Gastroenteritis Viral gastroenteritis is also known as stomach flu. This condition affects the stomach and intestinal tract. It can cause sudden diarrhea and vomiting. The illness typically lasts 3 to 8 days. Most people develop an immune response that eventually gets rid of the virus. While this natural response develops, the virus can make you quite ill. CAUSES  Many different viruses can cause gastroenteritis, such as rotavirus or noroviruses. You can catch one of these viruses by consuming contaminated food or water. You may also catch a virus by sharing utensils or other personal items with an infected person or by touching a contaminated surface. SYMPTOMS  The most common symptoms are diarrhea and vomiting. These problems can cause a severe loss of body fluids (dehydration) and a body salt (electrolyte) imbalance. Other symptoms may include:  Fever.  Headache.  Fatigue.  Abdominal pain. DIAGNOSIS  Your caregiver can usually diagnose viral gastroenteritis based on your symptoms and a physical exam. A stool sample may also be taken to test for the presence of viruses or other infections. TREATMENT  This illness typically goes away on its own. Treatments are aimed at rehydration. The most serious cases of viral gastroenteritis involve vomiting so severely that you are not able to keep fluids down. In these cases, fluids must be given through an intravenous line (IV). HOME CARE INSTRUCTIONS   Drink enough fluids to keep your urine clear or pale yellow. Drink small amounts of fluids frequently and increase the amounts as tolerated.  Ask your caregiver for specific rehydration instructions.  Avoid:  Foods high in sugar.  Alcohol.  Carbonated drinks.  Tobacco.  Juice.  Caffeine drinks.  Extremely hot or cold fluids.  Fatty, greasy foods.  Too much intake of anything at one time.  Dairy products until 24 to 48 hours after diarrhea stops.  You may consume probiotics.  Probiotics are active cultures of beneficial bacteria. They may lessen the amount and number of diarrheal stools in adults. Probiotics can be found in yogurt with active cultures and in supplements.  Wash your hands well to avoid spreading the virus.  Only take over-the-counter or prescription medicines for pain, discomfort, or fever as directed by your caregiver. Do not give aspirin to children. Antidiarrheal medicines are not recommended.  Ask your caregiver if you should continue to take your regular prescribed and over-the-counter medicines.  Keep all follow-up appointments as directed by your caregiver. SEEK IMMEDIATE MEDICAL CARE IF:   You are unable to keep fluids down.  You do not urinate at least once every 6 to 8 hours.  You develop shortness of breath.  You notice blood in your stool or vomit. This may look like coffee grounds.  You have abdominal pain that increases or is concentrated in one small area (localized).  You have persistent vomiting or diarrhea.  You have a fever.  The patient is a child younger than 3 months, and he or she has a fever.  The patient is a child older than 3 months, and he or she has a fever and persistent symptoms.  The patient is a child older than 3 months, and he or she has a fever and symptoms suddenly get worse.  The patient is a baby, and he or she has no tears when crying. MAKE SURE YOU:   Understand these instructions.  Will watch your condition.  Will get help right away if you are not doing well or get worse. Document Released: 11/17/2005 Document Revised: 02/09/2012 Document Reviewed: 09/03/2011   [employer] Patient Information 2014 [employer].  

## 2014-01-20 NOTE — ED Provider Notes (Signed)
CSN: [id number]     Arrival date & time 01/20/14  0820 History   First MD Initiated Contact with Patient 01/20/14 [phone]     Chief Complaint  Patient presents with  . Emesis  . Diarrhea     (Consider location/radiation/quality/duration/timing/severity/associated sxs/prior Treatment) HPI  This a 48 year old male with history of hypertension, hypercholesterolemia who presents with nausea, vomiting, and diarrhea. Onset of symptoms was yesterday. Patient states that he ate Dr. Alvester MorinBell yesterday afternoon and had onset of symptoms approximately one hour following. He reports diffuse abdominal pain that is crampy and nonradiating. He rates his pain right now at 8/10. He states that he "vomited all night." He denies any blood in his emesis or stools. He denies any sick contacts. He endorses chills without fever.  Past Medical History  Diagnosis Date  . Hypertension   . Gout   . Gout   . Hypercholesteremia    History reviewed. No pertinent past surgical history. Family History  Problem Relation Age of Onset  . Hypertension Mother   . Stroke Father   . Stroke Brother   . Osteoarthritis Brother    History  Substance Use Topics  . Smoking status: Current Some Day Smoker -- 32 years    Types: Cigars  . Smokeless tobacco: Never Used  . Alcohol Use: No    Review of Systems  Constitutional: Positive for chills. Negative for fever.  Respiratory: Negative.  Negative for chest tightness and shortness of breath.   Cardiovascular: Negative.  Negative for chest pain.  Gastrointestinal: Positive for nausea, abdominal pain and diarrhea.  Genitourinary: Negative.  Negative for dysuria.  Musculoskeletal: Negative for back pain.  Skin: Negative for rash.  Neurological: Negative for headaches.  All other systems reviewed and are negative.      Allergies  Review of patient's allergies indicates no known allergies.  Home Medications   Current Outpatient Rx  Name  Route  Sig  Dispense  Refill   . fish oil-omega-3 fatty acids 1000 MG capsule   Oral   Take 1 g by mouth daily.         . Ginkgo Biloba 40 MG TABS   Oral   Take 1 tablet by mouth daily.         Marland Kitchen. ibuprofen (ADVIL,MOTRIN) 200 MG tablet   Oral   Take 800 mg by mouth every 6 (six) hours as needed. Pain         . Multiple Vitamin (ONE-A-DAY MENS PO)   Oral   Take 1 tablet by mouth daily.           Marland Kitchen. oxymetazoline (AFRIN) 0.05 % nasal spray   Nasal   Place 2 sprays into the nose daily as needed for congestion.         Marland Kitchen. tetrahydrozoline-zinc (VISINE-AC) 0.05-0.25 % ophthalmic solution   Both Eyes   Place 1 drop into both eyes daily as needed (irritation).         Marland Kitchen. lisinopril (PRINIVIL,ZESTRIL) 20 MG tablet   Oral   Take 1 tablet (20 mg total) by mouth daily.   30 tablet   0   . loperamide (IMODIUM) 2 MG capsule   Oral   Take 1 capsule (2 mg total) by mouth 4 (four) times daily as needed for diarrhea or loose stools.   12 capsule   0   . ondansetron (ZOFRAN ODT) 4 MG disintegrating tablet   Oral   Take 1 tablet (4 mg total) by mouth every 8 (  eight) hours as needed for nausea or vomiting.   20 tablet   0    BP 149/92  Pulse 98  Temp(Src) 98.7 F (37.1 C) (Oral)  Resp 20  SpO2 97% Physical Exam  Nursing note and vitals reviewed. Constitutional: He is oriented to person, place, and time. He appears well-developed and well-nourished. No distress.  Overweight  HENT:  Head: Normocephalic and atraumatic.  Eyes: Pupils are equal, round, and reactive to light.  Neck: Neck supple.  Cardiovascular: Normal rate, regular rhythm and normal heart sounds.   No murmur heard. Pulmonary/Chest: Effort normal and breath sounds normal. No respiratory distress. He has no wheezes.  Abdominal: Soft. Bowel sounds are normal. He exhibits no distension. There is no tenderness. There is no rebound and no guarding.  Musculoskeletal: He exhibits no edema.  Lymphadenopathy:    He has no cervical adenopathy.   Neurological: He is alert and oriented to person, place, and time.  Skin: Skin is warm and dry.  Psychiatric: He has a normal mood and affect.    ED Course  Procedures (including critical care time) Labs Review Labs Reviewed  CBC WITH DIFFERENTIAL - Abnormal; Notable for the following:    RBC 5.89 (*)    Neutrophils Relative % 92 (*)    Lymphocytes Relative 4 (*)    Lymphs Abs 0.3 (*)    All other components within normal limits  COMPREHENSIVE METABOLIC PANEL - Abnormal; Notable for the following:    Glucose, Bld 122 (*)    GFR calc non Af Amer 79 (*)    All other components within normal limits  LIPASE, BLOOD   Imaging Review No results found.  EKG Interpretation   None       MDM   Final diagnoses:  Abdominal pain, vomiting, and diarrhea    Patient presents with n/v/d and abdominal pain.  Patient nontoxic and vs wnl.  Exam without signs of peritonitis.  Labs reassuring.  Patient given antiemetic.  Feel symptoms are likely related to viral syndrome.  Patient given strict return precautions.  After history, exam, and medical workup I feel the patient has been appropriately medically screened and is safe for discharge home. Pertinent diagnoses were discussed with the patient. Patient was given return precautions.     Shon Baton, MD 01/20/14 [phone]

## 2014-01-20 NOTE — ED Notes (Signed)
N/v/d since yesterday. Vomited all night per pt. Diarrhea x 4. All over abd and side soreness. Mm wet. Nad. deneis black or bloody emesis/stool

## 2014-02-24 ENCOUNTER — Emergency Department (HOSPITAL_COMMUNITY): Payer: BC Managed Care – PPO

## 2014-02-24 ENCOUNTER — Emergency Department (HOSPITAL_COMMUNITY)
Admission: EM | Admit: 2014-02-24 | Discharge: 2014-02-24 | Disposition: A | Discharge status: Home / Self Care | Payer: BC Managed Care – PPO | Attending: Emergency Medicine | Admitting: Emergency Medicine

## 2014-02-24 ENCOUNTER — Encounter (HOSPITAL_COMMUNITY): Payer: Self-pay | Admitting: Emergency Medicine

## 2014-02-24 DIAGNOSIS — I10 Essential (primary) hypertension: Secondary | ICD-10-CM | POA: Insufficient documentation

## 2014-02-24 DIAGNOSIS — Z8639 Personal history of other endocrine, nutritional and metabolic disease: Secondary | ICD-10-CM | POA: Insufficient documentation

## 2014-02-24 DIAGNOSIS — F17200 Nicotine dependence, unspecified, uncomplicated: Secondary | ICD-10-CM | POA: Insufficient documentation

## 2014-02-24 DIAGNOSIS — Y939 Activity, unspecified: Secondary | ICD-10-CM | POA: Insufficient documentation

## 2014-02-24 DIAGNOSIS — Z79899 Other long term (current) drug therapy: Secondary | ICD-10-CM | POA: Insufficient documentation

## 2014-02-24 DIAGNOSIS — Y929 Unspecified place or not applicable: Secondary | ICD-10-CM | POA: Insufficient documentation

## 2014-02-24 DIAGNOSIS — Z862 Personal history of diseases of the blood and blood-forming organs and certain disorders involving the immune mechanism: Secondary | ICD-10-CM | POA: Insufficient documentation

## 2014-02-24 DIAGNOSIS — M109 Gout, unspecified: Secondary | ICD-10-CM

## 2014-02-24 DIAGNOSIS — IMO0002 Reserved for concepts with insufficient information to code with codable children: Secondary | ICD-10-CM | POA: Insufficient documentation

## 2014-02-24 DIAGNOSIS — R296 Repeated falls: Secondary | ICD-10-CM | POA: Insufficient documentation

## 2014-02-24 LAB — URIC ACID: Uric Acid, Serum: 8.9 mg/dL — ABNORMAL HIGH (ref 4.0–7.8)

## 2014-02-24 MED ORDER — PREDNISONE 10 MG PO TABS
60.0000 mg | ORAL_TABLET | Freq: Once | ORAL | Status: AC
  Administered 2014-02-24: 60 mg via ORAL
  Filled 2014-02-24 (×2): qty 1

## 2014-02-24 MED ORDER — OXYCODONE-ACETAMINOPHEN 5-325 MG PO TABS: 1.0000 | ORAL_TABLET | ORAL | Status: DC | PRN

## 2014-02-24 MED ORDER — OXYCODONE-ACETAMINOPHEN 5-325 MG PO TABS
1.0000 | ORAL_TABLET | Freq: Once | ORAL | Status: AC
  Administered 2014-02-24: 1 via ORAL
  Filled 2014-02-24: qty 1

## 2014-02-24 MED ORDER — PREDNISONE 10 MG PO TABS: ORAL_TABLET | ORAL | Status: DC

## 2014-02-24 NOTE — ED Notes (Signed)
Pt c/o right middle finger pain and swelling. Pt states he fell yesterday and his finger "bent backwards".

## 2014-02-24 NOTE — Discharge Instructions (Signed)

## 2014-02-26 NOTE — ED Provider Notes (Signed)
CSN: [id number]     Arrival date & time 02/24/14  [id number] History   First MD Initiated Contact with Patient 02/24/14 1005     Chief Complaint  Patient presents with  . Finger Injury     (Consider location/radiation/quality/duration/timing/severity/associated sxs/prior Treatment) Patient is a 48 y.o. male presenting with hand pain. The history is provided by the patient.  Hand Pain This is a new problem. The current episode started in the past 7 days. The problem occurs constantly. The problem has been unchanged. Associated symptoms include arthralgias and joint swelling. Pertinent negatives include no chills, fever, neck pain, numbness, rash or weakness. The symptoms are aggravated by bending. He has tried nothing for the symptoms. The treatment provided no relief.   Patient c/o pain and swelling to the right middle finger for several days but states the pain became worse after a hypertension of the finger. Pt has hx of gout and states pain feels similar.   No hx of previous finger fracture.    Past Medical History  Diagnosis Date  . Hypertension   . Gout   . Gout   . Hypercholesteremia    History reviewed. No pertinent past surgical history. Family History  Problem Relation Age of Onset  . Hypertension Mother   . Stroke Father   . Stroke Brother   . Osteoarthritis Brother    History  Substance Use Topics  . Smoking status: Current Some Day Smoker -- 32 years    Types: Cigars  . Smokeless tobacco: Never Used  . Alcohol Use: No    Review of Systems  Constitutional: Negative for fever and chills.  Genitourinary: Negative for dysuria and difficulty urinating.  Musculoskeletal: Positive for arthralgias and joint swelling. Negative for neck pain.  Skin: Negative for color change, rash and wound.  Neurological: Negative for weakness and numbness.  All other systems reviewed and are negative.      Allergies  Review of patient's allergies indicates no known allergies.  Home  Medications   Current Outpatient Rx  Name  Route  Sig  Dispense  Refill  . fish oil-omega-3 fatty acids 1000 MG capsule   Oral   Take 1 g by mouth daily.         . Ginkgo Biloba 40 MG TABS   Oral   Take 1 tablet by mouth daily.         . Multiple Vitamin (ONE-A-DAY MENS PO)   Oral   Take 1 tablet by mouth daily.           Marland Kitchen. oxymetazoline (AFRIN) 0.05 % nasal spray   Nasal   Place 2 sprays into the nose daily as needed for congestion.         Marland Kitchen. tetrahydrozoline-zinc (VISINE-AC) 0.05-0.25 % ophthalmic solution   Both Eyes   Place 1 drop into both eyes daily as needed (irritation).         Marland Kitchen. oxyCODONE-acetaminophen (PERCOCET/ROXICET) 5-325 MG per tablet   Oral   Take 1 tablet by mouth every 4 (four) hours as needed for severe pain.   10 tablet   0   . predniSONE (DELTASONE) 10 MG tablet      Take 6 tablets day one, 5 tablets day two, 4 tablets day three, 3 tablets day four, 2 tablets day five, then 1 tablet day six   21 tablet   0    BP 162/109  Pulse 62  Temp(Src) 97.8 F (36.6 C) (Oral)  Resp 15  SpO2 99%  Physical Exam  Nursing note and vitals reviewed. Constitutional: He is oriented to person, place, and time. He appears well-developed and well-nourished. No distress.  HENT:  Head: Normocephalic and atraumatic.  Cardiovascular: Normal rate, regular rhythm, normal heart sounds and intact distal pulses.   No murmur heard. Pulmonary/Chest: Effort normal and breath sounds normal. No respiratory distress.  Musculoskeletal: He exhibits edema and tenderness.  ttp of the distal right middle finger.  Moderate STS and erythema at the DIP.  Radial pulse is brisk, distal sensation intact.  CR< 2 sec.  No bony deformity.    Neurological: He is alert and oriented to person, place, and time. He exhibits normal muscle tone. Coordination normal.  Skin: Skin is warm and dry.    ED Course  Procedures (including critical care time) Labs Review Labs Reviewed  URIC  ACID - Abnormal; Notable for the following:    Uric Acid, Serum 8.9 (*)    All other components within normal limits   Imaging Review Dg Hand Complete Right  02/24/2014   CLINICAL DATA:  Pain and swelling right middle finger. Gout. Finger injury  EXAM: RIGHT HAND - COMPLETE 3+ VIEW  COMPARISON:  12/19/2012  FINDINGS: Negative for fracture or dislocation.  Bony erosions consistent with gout, similar to the prior study. Erosive changes are present in the triquetrum, navicular and hamate, and distal second and third metacarpal bones.  IMPRESSION: Negative for fracture.  Erosive changes consistent with the history of gout   Electronically Signed   By: Marlan Palau M.D.   On: 02/24/2014 11:05    EKG Interpretation None      MDM   Final diagnoses:  Gout    Pt with hx of gout c/o pain to the distal middle finger for several days and increased pain after a fall.  XR is neg for fx.  Uric acid level is elevated. Pain likely related to gout flare with possible sprain.  No wounds to the finger, tenderness and erythema is localized to the DIP joint only.    patient declines finger splint.  He agrees to close f/u with his PMD for recheck.  Pt feeling better after medication and requesting discharge.  Agrees to return if needed.  Appears stable for d/c    Delvin Hedeen L. Trisha Mangle, PA-C 02/26/14 2202

## 2014-02-28 NOTE — ED Provider Notes (Signed)
Medical screening examination/treatment/procedure(s) were performed by non-physician practitioner and as supervising physician I was immediately available for consultation/collaboration.   EKG Interpretation None        Particia Strahm M Messina Kosinski, DO 02/28/14 1037 

## 2014-06-16 ENCOUNTER — Encounter (HOSPITAL_COMMUNITY): Payer: Self-pay | Admitting: Emergency Medicine

## 2014-06-16 ENCOUNTER — Emergency Department (HOSPITAL_COMMUNITY)
Admission: EM | Admit: 2014-06-16 | Discharge: 2014-06-16 | Disposition: A | Discharge status: Home / Self Care | Payer: BC Managed Care – PPO | Attending: Emergency Medicine | Admitting: Emergency Medicine

## 2014-06-16 ENCOUNTER — Emergency Department (HOSPITAL_COMMUNITY): Payer: BC Managed Care – PPO

## 2014-06-16 DIAGNOSIS — Z79899 Other long term (current) drug therapy: Secondary | ICD-10-CM | POA: Insufficient documentation

## 2014-06-16 DIAGNOSIS — I10 Essential (primary) hypertension: Secondary | ICD-10-CM

## 2014-06-16 DIAGNOSIS — Z8639 Personal history of other endocrine, nutritional and metabolic disease: Secondary | ICD-10-CM | POA: Insufficient documentation

## 2014-06-16 DIAGNOSIS — Z87891 Personal history of nicotine dependence: Secondary | ICD-10-CM | POA: Insufficient documentation

## 2014-06-16 DIAGNOSIS — R42 Dizziness and giddiness: Secondary | ICD-10-CM

## 2014-06-16 DIAGNOSIS — M549 Dorsalgia, unspecified: Secondary | ICD-10-CM | POA: Insufficient documentation

## 2014-06-16 DIAGNOSIS — H5789 Other specified disorders of eye and adnexa: Secondary | ICD-10-CM | POA: Insufficient documentation

## 2014-06-16 DIAGNOSIS — E7800 Pure hypercholesterolemia, unspecified: Secondary | ICD-10-CM | POA: Insufficient documentation

## 2014-06-16 DIAGNOSIS — Z862 Personal history of diseases of the blood and blood-forming organs and certain disorders involving the immune mechanism: Secondary | ICD-10-CM | POA: Insufficient documentation

## 2014-06-16 LAB — BASIC METABOLIC PANEL
Anion gap: 12 (ref 5–15)
BUN: 22 mg/dL (ref 6–23)
CO2: 23 mEq/L (ref 19–32)
Calcium: 9.2 mg/dL (ref 8.4–10.5)
Chloride: 106 mEq/L (ref 96–112)
Creatinine, Ser: 1.06 mg/dL (ref 0.50–1.35)
GFR, EST NON AFRICAN AMERICAN: 81 mL/min — AB (ref >90)
Glucose, Bld: 100 mg/dL — ABNORMAL HIGH (ref 70–99)
Potassium: 4.2 mEq/L (ref 3.7–5.3)
SODIUM: 141 meq/L (ref 137–147)

## 2014-06-16 LAB — CBC WITH DIFFERENTIAL/PLATELET
BASOS PCT: 1 % (ref 0–1)
Basophils Absolute: 0 10*3/uL (ref 0.0–0.1)
EOS ABS: 0.2 10*3/uL (ref 0.0–0.7)
Eosinophils Relative: 2 % (ref 0–5)
HCT: 45.5 % (ref 39.0–52.0)
Hemoglobin: 15.8 g/dL (ref 13.0–17.0)
Lymphocytes Relative: 28 % (ref 12–46)
Lymphs Abs: 1.8 10*3/uL (ref 0.7–4.0)
MCH: 27.3 pg (ref 26.0–34.0)
MCHC: 34.7 g/dL (ref 30.0–36.0)
MCV: 78.7 fL (ref 78.0–100.0)
Monocytes Absolute: 0.5 10*3/uL (ref 0.1–1.0)
Monocytes Relative: 8 % (ref 3–12)
Neutro Abs: 3.7 10*3/uL (ref 1.7–7.7)
Neutrophils Relative %: 61 % (ref 43–77)
PLATELETS: 241 10*3/uL (ref 150–400)
RBC: 5.78 MIL/uL (ref 4.22–5.81)
RDW: 14.4 % (ref 11.5–15.5)
WBC: 6.2 10*3/uL (ref 4.0–10.5)

## 2014-06-16 MED ORDER — CLONIDINE HCL 0.2 MG PO TABS
0.2000 mg | ORAL_TABLET | Freq: Once | ORAL | Status: DC
  Filled 2014-06-16: qty 1

## 2014-06-16 MED ORDER — LISINOPRIL 20 MG PO TABS
20.0000 mg | ORAL_TABLET | Freq: Every day | ORAL | Status: DC
Start: 2014-06-16 — End: 2014-11-13

## 2014-06-16 MED ORDER — MECLIZINE HCL 12.5 MG PO TABS
25.0000 mg | ORAL_TABLET | Freq: Once | ORAL | Status: AC
  Administered 2014-06-16: 25 mg via ORAL
  Filled 2014-06-16: qty 2

## 2014-06-16 NOTE — Discharge Instructions (Signed)
Benign Positional Vertigo Vertigo means you feel like you or your surroundings are moving when they are not. Benign positional vertigo is the most common form of vertigo. Benign means that the cause of your condition is not serious. Benign positional vertigo is more common in older adults. CAUSES  Benign positional vertigo is the result of an upset in the labyrinth system. This is an area in the middle ear that helps control your balance. This may be caused by a viral infection, head injury, or repetitive motion. However, often no specific cause is found. SYMPTOMS  Symptoms of benign positional vertigo occur when you move your head or eyes in different directions. Some of the symptoms may include:  Loss of balance and falls.  Vomiting.  Blurred vision.  Dizziness.  Nausea.  Involuntary eye movements (nystagmus). DIAGNOSIS  Benign positional vertigo is usually diagnosed by physical exam. If the specific cause of your benign positional vertigo is unknown, your caregiver may perform imaging tests, such as magnetic resonance imaging (MRI) or computed tomography (CT). TREATMENT  Your caregiver may recommend movements or procedures to correct the benign positional vertigo. Medicines such as meclizine, benzodiazepines, and medicines for nausea may be used to treat your symptoms. In rare cases, if your symptoms are caused by certain conditions that affect the inner ear, you may need surgery. HOME CARE INSTRUCTIONS   Follow your caregiver's instructions.  Move slowly. Do not make sudden body or head movements.  Avoid driving.  Avoid operating heavy machinery.  Avoid performing any tasks that would be dangerous to you or others during a vertigo episode.  Drink enough fluids to keep your urine clear or pale yellow. SEEK IMMEDIATE MEDICAL CARE IF:   You develop problems with walking, weakness, numbness, or using your arms, hands, or legs.  You have difficulty speaking.  You develop  severe headaches.  Your nausea or vomiting continues or gets worse.  You develop visual changes.  Your family or friends notice any behavioral changes.  Your condition gets worse.  You have a fever.  You develop a stiff neck or sensitivity to light. MAKE SURE YOU:   Understand these instructions.  Will watch your condition.  Will get help right away if you are not doing well or get worse. Document Released: 08/25/2006 Document Revised: 02/09/2012 Document Reviewed: 08/07/2011 [facility] Patient Information 2015 [place], [place]. This information is not intended to replace advice given to you by your health care provider. Make sure you discuss any questions you have with your health care provider.  Taking ever as directed and as needed for the dizziness. Restart her to Cipro blood pressure medicine make appointment to followup with your doctor next week. Return for any new or worse symptoms.

## 2014-06-16 NOTE — ED Notes (Signed)
Pt states he feels dizzy, pressure around his eyes, concerned his BP may be elevated. States "I just don't feel right".

## 2014-06-16 NOTE — ED Provider Notes (Signed)
CSN: [id number]     Arrival date & time 06/16/14  1250 History  This chart was scribed for Vanetta Mulders, MD by Elon Spanner, ED Scribe. This patient was seen in room [facility]18[facility]18 and the patient's care was started at 1:32 PM.    Chief Complaint  Patient presents with  . Dizziness    The history is provided by the patient. No language interpreter was used.    HPI Comments: Cody Harmon is a 48 y.o. male who presents to the Emergency Department complaining of episodes of unchanged lightheadedness and dizziness with room spinning that began yesterday at work. The patient reports associated pressure around bilateral eyes.  Patient has HTN for which he is prescribed Lisinopril 25 mg but is non-compliant for the last several months.  Patient states he is currently experiencing minor upper back pain.  Patient denies HA, CP, dyspnea, fever, chills, visual changes, cough, rhinorrhea, sore throat, nausea, vomiting, diarrhea, dysuria, extremity swelling, rash, blood disorders.    Past Medical History  Diagnosis Date  . Hypertension   . Gout   . Gout   . Hypercholesteremia    History reviewed. No pertinent past surgical history. Family History  Problem Relation Age of Onset  . Hypertension Mother   . Stroke Father   . Stroke Brother   . Osteoarthritis Brother    History  Substance Use Topics  . Smoking status: Former Smoker -- 32 years    Types: Cigars  . Smokeless tobacco: Never Used  . Alcohol Use: No    Review of Systems  Constitutional: Negative for fever and chills.  HENT: Negative for rhinorrhea and sore throat.   Eyes: Negative for visual disturbance.       +Pressure around bilateral eyes  Respiratory: Negative for cough and shortness of breath.   Cardiovascular: Negative for chest pain and leg swelling.  Gastrointestinal: Negative for nausea, vomiting, abdominal pain and diarrhea.  Genitourinary: Negative for dysuria.  Musculoskeletal: Positive for back pain. Negative  for neck pain.  Skin: Negative for rash.  Neurological: Positive for dizziness and light-headedness. Negative for headaches.  Hematological: Does not bruise/bleed easily.  Psychiatric/Behavioral: Negative for confusion.      Allergies  Review of patient's allergies indicates no known allergies.  Home Medications   Prior to Admission medications   Medication Sig Start Date End Date Taking? Authorizing Provider  fish oil-omega-3 fatty acids 1000 MG capsule Take 1 g by mouth daily.   Yes Historical Provider, MD  Ginkgo Biloba 40 MG TABS Take 1 tablet by mouth daily.   Yes Historical Provider, MD  Multiple Vitamin (ONE-A-DAY MENS PO) Take 1 tablet by mouth daily.     Yes Historical Provider, MD  oxymetazoline (AFRIN) 0.05 % nasal spray Place 2 sprays into the nose daily as needed for congestion.   Yes Historical Provider, MD  tetrahydrozoline-zinc (VISINE-AC) 0.05-0.25 % ophthalmic solution Place 1 drop into both eyes daily as needed (irritation).   Yes Historical Provider, MD   BP 142/97  Pulse 70  Temp(Src) 98.1 F (36.7 C) (Oral)  Resp 16  Ht 6\' 2"  (1.88 m)  Wt 281 lb (127.461 kg)  BMI 36.06 kg/m2  SpO2 99% Physical Exam  Nursing note and vitals reviewed. Constitutional: He is oriented to person, place, and time. He appears well-developed and well-nourished. No distress.  HENT:  Head: Normocephalic and atraumatic.  Eyes: Conjunctivae and EOM are normal.  Neck: Normal range of motion. Neck supple. No tracheal deviation present.  Cardiovascular: Normal  rate, regular rhythm and normal heart sounds.   Pulmonary/Chest: Effort normal and breath sounds normal. No respiratory distress. He has no wheezes. He has no rales.  CTA bilaterally   Abdominal: Soft. Bowel sounds are normal. There is no tenderness.  Musculoskeletal: Normal range of motion. He exhibits no edema.  Moves bilateral hands and feet well.   Neurological: He is alert and oriented to person, place, and time. No  cranial nerve deficit. He exhibits normal muscle tone. Coordination normal.  Skin: Skin is warm and dry.  Psychiatric: He has a normal mood and affect. His behavior is normal.    ED Course  Procedures (including critical care time)  DIAGNOSTIC STUDIES: Oxygen Saturation is 99% on RA, normal by my interpretation.    COORDINATION OF CARE  1:43 PM Discussed plans to order labs and imaging.  Patient acknowledges and agrees with plan.    Labs Review Labs Reviewed  BASIC METABOLIC PANEL - Abnormal; Notable for the following:    Glucose, Bld 100 (*)    GFR calc non Af Amer 81 (*)    All other components within normal limits  CBC WITH DIFFERENTIAL   Results for orders placed during the hospital encounter of 06/16/14  CBC WITH DIFFERENTIAL      Result Value Ref Range   WBC 6.2  4.0 - 10.5 K/uL   RBC 5.78  4.22 - 5.81 MIL/uL   Hemoglobin 15.8  13.0 - 17.0 g/dL   HCT 16.145.5  09.639.0 - 04.552.0 %   MCV 78.7  78.0 - 100.0 fL   MCH 27.3  26.0 - 34.0 pg   MCHC 34.7  30.0 - 36.0 g/dL   RDW 40.914.4  81.111.5 - 91.415.5 %   Platelets 241  150 - 400 K/uL   Neutrophils Relative % 61  43 - 77 %   Neutro Abs 3.7  1.7 - 7.7 K/uL   Lymphocytes Relative 28  12 - 46 %   Lymphs Abs 1.8  0.7 - 4.0 K/uL   Monocytes Relative 8  3 - 12 %   Monocytes Absolute 0.5  0.1 - 1.0 K/uL   Eosinophils Relative 2  0 - 5 %   Eosinophils Absolute 0.2  0.0 - 0.7 K/uL   Basophils Relative 1  0 - 1 %   Basophils Absolute 0.0  0.0 - 0.1 K/uL  BASIC METABOLIC PANEL      Result Value Ref Range   Sodium 141  137 - 147 mEq/L   Potassium 4.2  3.7 - 5.3 mEq/L   Chloride 106  96 - 112 mEq/L   CO2 23  19 - 32 mEq/L   Glucose, Bld 100 (*) 70 - 99 mg/dL   BUN 22  6 - 23 mg/dL   Creatinine, Ser 7.821.06  0.50 - 1.35 mg/dL   Calcium 9.2  8.4 - 95.610.5 mg/dL   GFR calc non Af Amer 81 (*) >90 mL/min   GFR calc Af Amer >90  >90 mL/min   Anion gap 12  5 - 15     Imaging Review Ct Head Wo Contrast  06/16/2014   CLINICAL DATA:  Dizziness,  history hypertension, hypercholesterolemia, gout, former smoker  EXAM: CT HEAD WITHOUT CONTRAST  TECHNIQUE: Contiguous axial images were obtained from the base of the skull through the vertex without intravenous contrast.  COMPARISON:  None  FINDINGS: Beam hardening artifacts from metallic foreign body at LEFT frontoparietal region.  Normal ventricular morphology.  No midline shift or mass effect.  Normal appearance of brain parenchyma.  No intracranial hemorrhage, mass lesion, or evidence acute infarction.  No extra-axial fluid collections.  Bones and sinuses unremarkable.  IMPRESSION: No acute intracranial abnormalities.   Electronically Signed   By: Ulyses Southward M.D.   On: 06/16/2014 14:57     EKG Interpretation None      MDM   Final diagnoses:  None    Patient with long-standing history of high blood pressure. Normal treat with a several periods been out of medication for several months. Patient today with elevated blood pressure also with some mild vertigo-type symptoms. Denied true room spinning however is noted that with quick movements of the head or bending over that the dizziness occurs. Will treat with Antivert. Head CT negative. Labs without significant abnormalities. Will restart lisinopril. We'll continue with Antivert. Patient will followup with primary care Dr. Clinically no evidence of stroke.  I personally performed the services described in this documentation, which was scribed in my presence. The recorded information has been reviewed and is accurate.     Vanetta Mulders, MD 06/16/14 1531

## 2014-06-16 NOTE — ED Notes (Signed)
Patient transported to CT 

## 2014-08-13 ENCOUNTER — Encounter (HOSPITAL_COMMUNITY): Payer: Self-pay | Admitting: Emergency Medicine

## 2014-08-13 ENCOUNTER — Emergency Department (HOSPITAL_COMMUNITY)
Admission: EM | Admit: 2014-08-13 | Discharge: 2014-08-13 | Disposition: A | Discharge status: Home / Self Care | Payer: BC Managed Care – PPO | Attending: Emergency Medicine | Admitting: Emergency Medicine

## 2014-08-13 DIAGNOSIS — M109 Gout, unspecified: Secondary | ICD-10-CM | POA: Insufficient documentation

## 2014-08-13 DIAGNOSIS — I10 Essential (primary) hypertension: Secondary | ICD-10-CM | POA: Diagnosis not present

## 2014-08-13 DIAGNOSIS — Z760 Encounter for issue of repeat prescription: Secondary | ICD-10-CM | POA: Insufficient documentation

## 2014-08-13 DIAGNOSIS — Z79899 Other long term (current) drug therapy: Secondary | ICD-10-CM | POA: Diagnosis not present

## 2014-08-13 DIAGNOSIS — Z87891 Personal history of nicotine dependence: Secondary | ICD-10-CM | POA: Insufficient documentation

## 2014-08-13 MED ORDER — PREDNISONE 20 MG PO TABS: 40.0000 mg | ORAL_TABLET | Freq: Every day | ORAL | Status: AC

## 2014-08-13 MED ORDER — OXYCODONE-ACETAMINOPHEN 5-325 MG PO TABS
1.0000 | ORAL_TABLET | Freq: Once | ORAL | Status: AC
  Administered 2014-08-13: 1 via ORAL
  Filled 2014-08-13: qty 1

## 2014-08-13 MED ORDER — LISINOPRIL 20 MG PO TABS: 20.0000 mg | ORAL_TABLET | Freq: Every day | ORAL | Status: DC

## 2014-08-13 MED ORDER — OXYCODONE-ACETAMINOPHEN 5-325 MG PO TABS: 1.0000 | ORAL_TABLET | ORAL | Status: AC | PRN

## 2014-08-13 MED ORDER — PREDNISONE 50 MG PO TABS
60.0000 mg | ORAL_TABLET | Freq: Once | ORAL | Status: AC
  Administered 2014-08-13: 60 mg via ORAL
  Filled 2014-08-13 (×2): qty 1

## 2014-08-13 NOTE — ED Notes (Signed)
Pt with right foot pain today, hx of gout and same symptoms as before in same foot, denies taking anything at home for it

## 2014-08-13 NOTE — Discharge Instructions (Signed)
Gout Gout is when your joints become red, sore, and swell (inflamed). This is caused by the buildup of uric acid crystals in the joints. Uric acid is a chemical that is normally in the blood. If the level of uric acid gets too high in the blood, these crystals form in your joints and tissues. Over time, these crystals can form into masses near the joints and tissues. These masses can destroy bone and cause the bone to look misshapen (deformed). HOME CARE   Do not take aspirin for pain.  Only take medicine as told by your doctor.  Rest the joint as much as you can. When in bed, keep sheets and blankets off painful areas.  Keep the sore joints raised (elevated).  Put warm or cold packs on painful joints. Use of warm or cold packs depends on which works best for you.  Use crutches if the painful joint is in your leg.  Drink enough fluids to keep your pee (urine) clear or pale yellow. Limit alcohol, sugary drinks, and drinks with fructose in them.  Follow your diet instructions. Pay careful attention to how much protein you eat. Include fruits, vegetables, whole grains, and fat-free or low-fat milk products in your daily diet. Talk to your doctor or dietitian about the use of coffee, vitamin C, and cherries. These may help lower uric acid levels.  Keep a healthy body weight. GET HELP RIGHT AWAY IF:   You have watery poop (diarrhea), throw up (vomit), or have any side effects from medicines.  You do not feel better in 24 hours, or you are getting worse.  Your joint becomes suddenly more tender, and you have chills or a fever. MAKE SURE YOU:   Understand these instructions.  Will watch your condition.  Will get help right away if you are not doing well or get worse. Document Released: 08/26/2008 Document Revised: 04/03/2014 Document Reviewed: 06/30/2012 [facility] Patient Information 2015 [place], [place]. This information is not intended to replace advice given to you by your health care  provider. Make sure you discuss any questions you have with your health care provider.  Managing Your High Blood Pressure Blood pressure is a measurement of how forceful your blood is pressing against the walls of the arteries. Arteries are muscular tubes within the circulatory system. Blood pressure does not stay the same. Blood pressure rises when you are active, excited, or nervous; and it lowers during sleep and relaxation. If the numbers measuring your blood pressure stay above normal most of the time, you are at risk for health problems. High blood pressure (hypertension) is a long-term (chronic) condition in which blood pressure is elevated. A blood pressure reading is recorded as two numbers, such as 120 over 80 (or 120/80). The first, higher number is called the systolic pressure. It is a measure of the pressure in your arteries as the heart beats. The second, lower number is called the diastolic pressure. It is a measure of the pressure in your arteries as the heart relaxes between beats.  Keeping your blood pressure in a normal range is important to your overall health and prevention of health problems, such as heart disease and stroke. When your blood pressure is uncontrolled, your heart has to work harder than normal. High blood pressure is a very common condition in adults because blood pressure tends to rise with age. Men and women are equally likely to have hypertension but at different times in life. Before age 64, men are more likely to  have hypertension. After 49 years of age, women are more likely to have it. Hypertension is especially common in African Americans. This condition often has no signs or symptoms. The cause of the condition is usually not known. Your caregiver can help you come up with a plan to keep your blood pressure in a normal, healthy range. BLOOD PRESSURE STAGES Blood pressure is classified into four stages: normal, prehypertension, stage 1, and stage 2. Your blood  pressure reading will be used to determine what type of treatment, if any, is necessary. Appropriate treatment options are tied to these four stages:  Normal  Systolic pressure (mm Hg): below 120.  Diastolic pressure (mm Hg): below 80. Prehypertension  Systolic pressure (mm Hg): 120 to 139.  Diastolic pressure (mm Hg): 80 to 89. Stage1  Systolic pressure (mm Hg): 140 to 159.  Diastolic pressure (mm Hg): 90 to 99. Stage2  Systolic pressure (mm Hg): 160 or above.  Diastolic pressure (mm Hg): 100 or above. RISKS RELATED TO HIGH BLOOD PRESSURE Managing your blood pressure is an important responsibility. Uncontrolled high blood pressure can lead to:  A heart attack.  A stroke.  A weakened blood vessel (aneurysm).  Heart failure.  Kidney damage.  Eye damage.  Metabolic syndrome.  Memory and concentration problems. HOW TO MANAGE YOUR BLOOD PRESSURE Blood pressure can be managed effectively with lifestyle changes and medicines (if needed). Your caregiver will help you come up with a plan to bring your blood pressure within a normal range. Your plan should include the following: Education  Read all information provided by your caregivers about how to control blood pressure.  Educate yourself on the latest guidelines and treatment recommendations. New research is always being done to further define the risks and treatments for high blood pressure. Lifestylechanges  Control your weight.  Avoid smoking.  Stay physically active.  Reduce the amount of salt in your diet.  Reduce stress.  Control any chronic conditions, such as high cholesterol or diabetes.  Reduce your alcohol intake. Medicines  Several medicines (antihypertensive medicines) are available, if needed, to bring blood pressure within a normal range. Communication  Review all the medicines you take with your caregiver because there may be side effects or interactions.  Talk with your caregiver  about your diet, exercise habits, and other lifestyle factors that may be contributing to high blood pressure.  See your caregiver regularly. Your caregiver can help you create and adjust your plan for managing high blood pressure. RECOMMENDATIONS FOR TREATMENT AND FOLLOW-UP  The following recommendations are based on current guidelines for managing high blood pressure in nonpregnant adults. Use these recommendations to identify the proper follow-up period or treatment option based on your blood pressure reading. You can discuss these options with your caregiver.  Systolic pressure of 120 to 139 or diastolic pressure of 80 to 89: Follow up with your caregiver as directed.  Systolic pressure of 140 to 160 or diastolic pressure of 90 to 100: Follow up with your caregiver within 2 months.  Systolic pressure above 160 or diastolic pressure above 100: Follow up with your caregiver within 1 month.  Systolic pressure above 180 or diastolic pressure above 110: Consider antihypertensive therapy; follow up with your caregiver within 1 week.  Systolic pressure above 200 or diastolic pressure above 120: Begin antihypertensive therapy; follow up with your caregiver within 1 week. Document Released: 08/11/2012 Document Reviewed: 08/11/2012 [facility] Patient Information 2015 [place], [place]. This information is not intended to replace advice given to you  by your health care provider. Make sure you discuss any questions you have with your health care provider. ° °

## 2014-08-15 NOTE — ED Provider Notes (Signed)
Medical screening examination/treatment/procedure(s) were performed by non-physician practitioner and as supervising physician I was immediately available for consultation/collaboration.   EKG Interpretation None        Benny Lennert, MD 08/15/14 1512

## 2014-08-15 NOTE — ED Provider Notes (Signed)
CSN: [id number]     Arrival date & time 08/13/14  2126 History   First MD Initiated Contact with Patient 08/13/14 2142     Chief Complaint  Patient presents with  . Gout     (Consider location/radiation/quality/duration/timing/severity/associated sxs/prior Treatment) HPI   Cody Harmon is a 48 y.o. male with a hx of gout presents to the Emergency Department c/o pain to his right foot and great toe.  He states the pain is similar to previous gout flares and he frequently has gout in the right foot.  He describes the pain as a burning sensation to his foot and pain with movement or weight bearing.  He denies swelling, known injury, numbness or weakness of the extremity.  He has not taken anything for the pain prior to ED arrival.    Past Medical History  Diagnosis Date  . Hypertension   . Gout   . Gout   . Hypercholesteremia    History reviewed. No pertinent past surgical history. Family History  Problem Relation Age of Onset  . Hypertension Mother   . Stroke Father   . Stroke Brother   . Osteoarthritis Brother    History  Substance Use Topics  . Smoking status: Former Smoker -- 32 years    Types: Cigars  . Smokeless tobacco: Never Used  . Alcohol Use: No    Review of Systems  Constitutional: Negative for fever and chills.  Gastrointestinal: Negative for nausea and vomiting.  Musculoskeletal: Positive for arthralgias. Negative for back pain and joint swelling.  Skin: Negative for color change, rash and wound.  All other systems reviewed and are negative.     Allergies  Review of patient's allergies indicates no known allergies.  Home Medications   Prior to Admission medications   Medication Sig Start Date End Date Taking? Authorizing Provider  fish oil-omega-3 fatty acids 1000 MG capsule Take 1 g by mouth daily.    Historical Provider, MD  Ginkgo Biloba 40 MG TABS Take 1 tablet by mouth daily.    Historical Provider, MD  lisinopril (PRINIVIL,ZESTRIL) 20 MG  tablet Take 1 tablet (20 mg total) by mouth daily. 06/16/14   Vanetta Mulders, MD  lisinopril (PRINIVIL,ZESTRIL) 20 MG tablet Take 1 tablet (20 mg total) by mouth daily. 08/13/14   Deonte Otting L. Neena Beecham, PA-C  Multiple Vitamin (ONE-A-DAY MENS PO) Take 1 tablet by mouth daily.      Historical Provider, MD  oxyCODONE-acetaminophen (PERCOCET/ROXICET) 5-325 MG per tablet Take 1 tablet by mouth every 4 (four) hours as needed. 08/13/14   Kinzlie Harney L. Riane Rung, PA-C  oxymetazoline (AFRIN) 0.05 % nasal spray Place 2 sprays into the nose daily as needed for congestion.    Historical Provider, MD  predniSONE (DELTASONE) 20 MG tablet Take 2 tablets (40 mg total) by mouth daily. For 5 days 08/13/14   Koula Venier L. Meiko Ives, PA-C  tetrahydrozoline-zinc (VISINE-AC) 0.05-0.25 % ophthalmic solution Place 1 drop into both eyes daily as needed (irritation).    Historical Provider, MD   BP 158/104  Pulse 68  Temp(Src) 99 F (37.2 C) (Oral)  Resp 18  Ht  (1.88 m)  Wt 275 lb (124.739 kg)  BMI 35.29 kg/m2  SpO2 99% Physical Exam  Nursing note and vitals reviewed. Constitutional: He is oriented to person, place, and time. He appears well-developed and well-nourished. No distress.  HENT:  Head: Normocephalic and atraumatic.  Cardiovascular: Normal rate, regular rhythm, normal heart sounds and intact distal pulses.   No murmur heard.  Pulmonary/Chest: Effort normal and breath sounds normal. No respiratory distress.  Musculoskeletal: He exhibits tenderness.  ttp of the distal right foot and base of the right great toe.    DP pulse is brisk,distal sensation intact.  No erythema,  bruising or bony deformity.  No proximal tenderness or edema  Neurological: He is alert and oriented to person, place, and time. He exhibits normal muscle tone. Coordination normal.  Skin: Skin is warm and dry.    ED Course  Procedures (including critical care time) Labs Review Labs Reviewed - No data to display  Imaging Review No results  found.   EKG Interpretation None      MDM   Final diagnoses:  Gout of right foot, unspecified cause, unspecified chronicity  Essential hypertension  Medication refill    Pt is hypertensive upon arrival.  States that he takes lisinopril but has been out of his medication for 2-3 days.  He denies any symptoms on this visit othr than foot pain.  He appears stable for d/c and remaining vitals are wnml.  I will refill his lisinopril and gave [facility].  He agrees to plan and appears stable for d/c    Shawntell Dixson L. Trisha Mangle, PA-C 08/15/14 1438

## 2014-09-24 ENCOUNTER — Encounter (HOSPITAL_COMMUNITY): Payer: Self-pay | Admitting: Emergency Medicine

## 2014-09-24 ENCOUNTER — Emergency Department (HOSPITAL_COMMUNITY)
Admission: EM | Admit: 2014-09-24 | Discharge: 2014-09-24 | Disposition: A | Discharge status: Home / Self Care | Payer: BC Managed Care – PPO | Attending: Emergency Medicine | Admitting: Emergency Medicine

## 2014-09-24 DIAGNOSIS — I10 Essential (primary) hypertension: Secondary | ICD-10-CM | POA: Insufficient documentation

## 2014-09-24 DIAGNOSIS — Z8639 Personal history of other endocrine, nutritional and metabolic disease: Secondary | ICD-10-CM | POA: Insufficient documentation

## 2014-09-24 DIAGNOSIS — M25562 Pain in left knee: Secondary | ICD-10-CM

## 2014-09-24 DIAGNOSIS — Z87891 Personal history of nicotine dependence: Secondary | ICD-10-CM | POA: Diagnosis not present

## 2014-09-24 DIAGNOSIS — M25532 Pain in left wrist: Secondary | ICD-10-CM | POA: Insufficient documentation

## 2014-09-24 DIAGNOSIS — Z760 Encounter for issue of repeat prescription: Secondary | ICD-10-CM | POA: Diagnosis not present

## 2014-09-24 DIAGNOSIS — Z79899 Other long term (current) drug therapy: Secondary | ICD-10-CM | POA: Insufficient documentation

## 2014-09-24 DIAGNOSIS — Z7952 Long term (current) use of systemic steroids: Secondary | ICD-10-CM | POA: Insufficient documentation

## 2014-09-24 MED ORDER — NAPROXEN 500 MG PO TABS: 500.0000 mg | ORAL_TABLET | Freq: Two times a day (BID) | ORAL | Status: AC

## 2014-09-24 MED ORDER — LISINOPRIL 20 MG PO TABS: 20.0000 mg | ORAL_TABLET | Freq: Every day | ORAL | Status: DC

## 2014-09-24 MED ORDER — HYDROCODONE-ACETAMINOPHEN 5-325 MG PO TABS: ORAL_TABLET | ORAL | Status: AC

## 2014-09-24 NOTE — ED Notes (Signed)
Pt with left knee pain for 2 days, swelling started last night per pt., pt denies injury; also with left wrist pain since this morning

## 2014-09-24 NOTE — ED Provider Notes (Signed)
CSN: [id number]     Arrival date & time 09/24/14  1251 History  This chart was scribed for non-physician practitioner Pauline Ausammy Marium Ragan, PA-C working with Linwood DibblesJon Knapp, MD by Littie Deedsichard Sun, ED Scribe. This patient was seen in room APFT21/APFT21 and the patient's care was started at 1:50 PM.     Chief Complaint  Patient presents with  . Knee Pain      The history is provided by the patient. No language interpreter was used.   HPI Comments: Cody Harmon is a 48 y.o. male with a hx of gout who presents to the Emergency Department complaining of constant left knee pain that began 2 days ago. He also notes having a sharp left wrist pain that began around the same time as his knee pain. Patient notes pain with bending and reports some swelling ot the knee that started last evening. He has not tried anything to alleviate the pain. Patient denies numbness and tingling, fall, redness, fever and chills. He states that he has had gout in the left knee before, and the pain he feels now somewhat resembles the pain he felt when he had gout there. He does not currently take any medications for his gout. He reports being out of his HTN medication 1 week ago. Patient started moving last week, carrying boxes up and down stairs; which he admits may have brought about these symptoms.   Past Medical History  Diagnosis Date  . Hypertension   . Gout   . Gout   . Hypercholesteremia    History reviewed. No pertinent past surgical history. Family History  Problem Relation Age of Onset  . Hypertension Mother   . Stroke Father   . Stroke Brother   . Osteoarthritis Brother    History  Substance Use Topics  . Smoking status: Former Smoker -- 32 years    Types: Cigars  . Smokeless tobacco: Never Used  . Alcohol Use: No    Review of Systems  Constitutional: Negative for fever and chills.  Genitourinary: Negative for dysuria and difficulty urinating.  Musculoskeletal: Positive for arthralgias.       Left wrist  and knee pain  Skin: Negative for color change and wound.  All other systems reviewed and are negative.     Allergies  Review of patient's allergies indicates no known allergies.  Home Medications   Prior to Admission medications   Medication Sig Start Date End Date Taking? Authorizing Provider  fish oil-omega-3 fatty acids 1000 MG capsule Take 1 g by mouth daily.    Historical Provider, MD  Ginkgo Biloba 40 MG TABS Take 1 tablet by mouth daily.    Historical Provider, MD  lisinopril (PRINIVIL,ZESTRIL) 20 MG tablet Take 1 tablet (20 mg total) by mouth daily. 06/16/14   Vanetta MuldersScott Zackowski, MD  lisinopril (PRINIVIL,ZESTRIL) 20 MG tablet Take 1 tablet (20 mg total) by mouth daily. 08/13/14   Kattleya Kuhnert L. Haylynn Pha, PA-C  Multiple Vitamin (ONE-A-DAY MENS PO) Take 1 tablet by mouth daily.      Historical Provider, MD  oxyCODONE-acetaminophen (PERCOCET/ROXICET) 5-325 MG per tablet Take 1 tablet by mouth every 4 (four) hours as needed. 08/13/14   Donell Sliwinski L. Detta Mellin, PA-C  oxymetazoline (AFRIN) 0.05 % nasal spray Place 2 sprays into the nose daily as needed for congestion.    Historical Provider, MD  predniSONE (DELTASONE) 20 MG tablet Take 2 tablets (40 mg total) by mouth daily. For 5 days 08/13/14   Gautam Langhorst L. Tyran Huser, PA-C  tetrahydrozoline-zinc (VISINE-AC) 0.05-0.25 %  ophthalmic solution Place 1 drop into both eyes daily as needed (irritation).    Historical Provider, MD   BP 161/109  Pulse 58  Temp(Src) 98.1 F (36.7 C) (Oral)  Resp 18  Ht 6\' 2"  (1.88 m)  Wt 278 lb (126.1 kg)  BMI 35.68 kg/m2  SpO2 96% Physical Exam  Nursing note and vitals reviewed. Constitutional: He is oriented to person, place, and time. He appears well-developed and well-nourished. No distress.  HENT:  Head: Normocephalic and atraumatic.  Mouth/Throat: Oropharynx is clear and moist. No oropharyngeal exudate.  Neck: Normal range of motion. Neck supple.  Cardiovascular: Normal rate, regular rhythm, normal heart sounds and  intact distal pulses.   No murmur heard. Pulmonary/Chest: Effort normal and breath sounds normal. No respiratory distress.  Musculoskeletal: Normal range of motion. He exhibits tenderness.  Tenderness of left lateral knee, minimal effusion, no erythema.  Pt has full ROM of the knee. Tenderness in lateral aspect of left wrist with no edema,no erythema. Sensation and pulses intact to both extremities.    Neurological: He is alert and oriented to person, place, and time. He exhibits normal muscle tone. Coordination normal.  Skin: Skin is warm and dry. No rash noted. No erythema.  Psychiatric: He has a normal mood and affect. His behavior is normal.    ED Course  Procedures  DIAGNOSTIC STUDIES: Oxygen Saturation is 96% on RA, adequate by my interpretation.    COORDINATION OF CARE: 1:55 PM-Discussed treatment plan which includes pain medication and ace wrap with pt at bedside and pt agreed to plan.   Labs Review Labs Reviewed - No data to display  Imaging Review No results found.   EKG Interpretation None      MDM   Final diagnoses:  Knee pain, acute, left  Medication refill    Pt is well appearing, non-toxic.  Only complaint is pain to left knee and left wrist, pain onset after lifting heavy boxes and walking up/down steps.  No concerning sx's for septic joint.  Mildly hypertensive today, but denies any related sx's.  I have advised me of risks of uncontrolled HTN to include MI and stroke.  He agrees to close f/u with clinic, referral info given and I have advised him that I will refill HTN medication with understanding that he will arrange f/u, pt agrees to plan and appears stable for d/c  I personally performed the services described in this documentation, which was scribed in my presence. The recorded information has been reviewed and is accurate.    Lauralee Waters L. Biana Haggar, PA-C 09/25/14 1702

## 2014-09-24 NOTE — Discharge Instructions (Signed)

## 2014-09-24 NOTE — ED Notes (Signed)
Acuity to 3 due to BP elevated

## 2014-09-24 NOTE — ED Notes (Addendum)
Pt states he ran out of BP meds for a week, pt states that he does not have PCP anymore and when asked about Dr. Phillips OdorGolding, pt responded "I just haven't seen him"

## 2014-09-26 NOTE — ED Provider Notes (Signed)
Medical screening examination/treatment/procedure(s) were performed by non-physician practitioner and as supervising physician I was immediately available for consultation/collaboration.    Almira Phetteplace, MD 09/26/14 1525 

## 2014-11-09 ENCOUNTER — Emergency Department (HOSPITAL_COMMUNITY): Payer: BC Managed Care – PPO

## 2014-11-09 ENCOUNTER — Encounter (HOSPITAL_COMMUNITY): Payer: Self-pay | Admitting: Emergency Medicine

## 2014-11-09 ENCOUNTER — Emergency Department (HOSPITAL_COMMUNITY)
Admission: EM | Admit: 2014-11-09 | Discharge: 2014-11-09 | Disposition: A | Discharge status: Home / Self Care | Payer: BC Managed Care – PPO | Attending: Emergency Medicine | Admitting: Emergency Medicine

## 2014-11-09 DIAGNOSIS — R0789 Other chest pain: Secondary | ICD-10-CM | POA: Diagnosis not present

## 2014-11-09 DIAGNOSIS — Z7952 Long term (current) use of systemic steroids: Secondary | ICD-10-CM | POA: Insufficient documentation

## 2014-11-09 DIAGNOSIS — R42 Dizziness and giddiness: Secondary | ICD-10-CM | POA: Diagnosis not present

## 2014-11-09 DIAGNOSIS — M549 Dorsalgia, unspecified: Secondary | ICD-10-CM | POA: Diagnosis not present

## 2014-11-09 DIAGNOSIS — I10 Essential (primary) hypertension: Secondary | ICD-10-CM | POA: Insufficient documentation

## 2014-11-09 DIAGNOSIS — E785 Hyperlipidemia, unspecified: Secondary | ICD-10-CM

## 2014-11-09 DIAGNOSIS — Z8639 Personal history of other endocrine, nutritional and metabolic disease: Secondary | ICD-10-CM | POA: Diagnosis not present

## 2014-11-09 DIAGNOSIS — Z8739 Personal history of other diseases of the musculoskeletal system and connective tissue: Secondary | ICD-10-CM | POA: Diagnosis not present

## 2014-11-09 DIAGNOSIS — Z791 Long term (current) use of non-steroidal anti-inflammatories (NSAID): Secondary | ICD-10-CM | POA: Insufficient documentation

## 2014-11-09 DIAGNOSIS — Z87891 Personal history of nicotine dependence: Secondary | ICD-10-CM | POA: Insufficient documentation

## 2014-11-09 DIAGNOSIS — R079 Chest pain, unspecified: Secondary | ICD-10-CM | POA: Diagnosis present

## 2014-11-09 DIAGNOSIS — Z79899 Other long term (current) drug therapy: Secondary | ICD-10-CM | POA: Insufficient documentation

## 2014-11-09 LAB — BASIC METABOLIC PANEL
ANION GAP: 14 (ref 5–15)
BUN: 20 mg/dL (ref 6–23)
CHLORIDE: 105 meq/L (ref 96–112)
CO2: 22 mEq/L (ref 19–32)
Calcium: 9.1 mg/dL (ref 8.4–10.5)
Creatinine, Ser: 1 mg/dL (ref 0.50–1.35)
GFR calc Af Amer: >90 mL/min (ref >90)
GFR calc non Af Amer: 87 mL/min — ABNORMAL LOW (ref >90)
Glucose, Bld: 119 mg/dL — ABNORMAL HIGH (ref 70–99)
Potassium: 3.8 mEq/L (ref 3.7–5.3)
SODIUM: 141 meq/L (ref 137–147)

## 2014-11-09 LAB — TROPONIN I: Troponin I: <0.3 ng/mL (ref <0.30)

## 2014-11-09 LAB — CBC WITH DIFFERENTIAL/PLATELET
Basophils Absolute: 0 10*3/uL (ref 0.0–0.1)
Basophils Relative: 0 % (ref 0–1)
Eosinophils Absolute: 0.2 10*3/uL (ref 0.0–0.7)
Eosinophils Relative: 1 % (ref 0–5)
HEMATOCRIT: 44.2 % (ref 39.0–52.0)
HEMOGLOBIN: 15.4 g/dL (ref 13.0–17.0)
LYMPHS PCT: 20 % (ref 12–46)
Lymphs Abs: 2.5 10*3/uL (ref 0.7–4.0)
MCH: 28.1 pg (ref 26.0–34.0)
MCHC: 34.8 g/dL (ref 30.0–36.0)
MCV: 80.5 fL (ref 78.0–100.0)
MONO ABS: 1.2 10*3/uL — AB (ref 0.1–1.0)
MONOS PCT: 9 % (ref 3–12)
Neutro Abs: 8.6 10*3/uL — ABNORMAL HIGH (ref 1.7–7.7)
Neutrophils Relative %: 70 % (ref 43–77)
Platelets: 225 10*3/uL (ref 150–400)
RBC: 5.49 MIL/uL (ref 4.22–5.81)
RDW: 13.7 % (ref 11.5–15.5)
WBC: 12.4 10*3/uL — ABNORMAL HIGH (ref 4.0–10.5)

## 2014-11-09 MED ORDER — AMLODIPINE BESYLATE 5 MG PO TABS
5.0000 mg | ORAL_TABLET | Freq: Every day | ORAL | Status: DC
  Administered 2014-11-09: 5 mg via ORAL
  Filled 2014-11-09: qty 1

## 2014-11-09 MED ORDER — FAMOTIDINE 20 MG PO TABS: 20.0000 mg | ORAL_TABLET | Freq: Two times a day (BID) | ORAL | Status: DC

## 2014-11-09 MED ORDER — AMLODIPINE BESYLATE 5 MG PO TABS: 5.0000 mg | ORAL_TABLET | Freq: Every day | ORAL | Status: DC

## 2014-11-09 MED ORDER — FAMOTIDINE 20 MG PO TABS
20.0000 mg | ORAL_TABLET | Freq: Every day | ORAL | Status: DC
  Administered 2014-11-09: 20 mg via ORAL
  Filled 2014-11-09: qty 1

## 2014-11-09 MED ORDER — NITROGLYCERIN 0.4 MG SL SUBL
0.4000 mg | SUBLINGUAL_TABLET | SUBLINGUAL | Status: AC | PRN
  Administered 2014-11-09 (×3): 0.4 mg via SUBLINGUAL
  Filled 2014-11-09: qty 1

## 2014-11-09 NOTE — ED Notes (Signed)
Pt reports decrease in chest tightness/pain after 3rd nitro.

## 2014-11-09 NOTE — ED Notes (Signed)
Pt c/o central cp/pressure since 0730 this am with back pain and lightheadedness. Pt states he has been out of his bp medication x a few days.

## 2014-11-09 NOTE — ED Provider Notes (Signed)
CSN: [id number]     Arrival date & time 11/09/14  0909 History  This chart was scribed for Donnetta HutchingBrian Vieno Tarrant, MD by Ronney LionSuzanne Le, ED Scribe. This patient was seen in room APA04/APA04 and the patient's care was started at 9:43 AM.    Chief Complaint  Patient presents with  . Chest Pain   The history is provided by the patient. No language interpreter was used.     HPI Comments: Cody Harmon is a 48 y.o. male who presents to the Emergency Department complaining of intermittent, central chest pressure radiating to his back that began 5 days ago and lasts 45 minutes at a time. This morning he felt the pressure when he woke up with a feeling of indigestion, prompting him to come to the ED.  He complains of associated lightheadedness. Nothing triggers or exacerbates the pressure. Patient has tried nothing for his pain. He hasn't taken Lisinopril for 3 days since he ran out of refills. He denies SOB, diaphoresis, and nausea. He denies DM, smoking, and cardiac problems, although his brother, dad, and granddad all have a history of CVA. Patient states he took aspirin regularly at one point but stopped because he had side effect of melena. He works at a job that is "not very physical."   Prior to Admission medications   Medication Sig Start Date End Date Taking? Authorizing Provider  fish oil-omega-3 fatty acids 1000 MG capsule Take 1 g by mouth daily.    Historical Provider, MD  Ginkgo Biloba 40 MG TABS Take 1 tablet by mouth daily.    Historical Provider, MD  HYDROcodone-acetaminophen (NORCO/VICODIN) 5-325 MG per tablet Take one-two tabs po q 4-6 hrs prn pain 09/24/14   Tammy L. Triplett, PA-C  lisinopril (PRINIVIL,ZESTRIL) 20 MG tablet Take 1 tablet (20 mg total) by mouth daily. 06/16/14   Vanetta MuldersScott Zackowski, MD  lisinopril (PRINIVIL,ZESTRIL) 20 MG tablet Take 1 tablet (20 mg total) by mouth daily. 08/13/14   Tammy L. Triplett, PA-C  lisinopril (PRINIVIL,ZESTRIL) 20 MG tablet Take 1 tablet (20 mg total) by mouth  daily. 09/24/14   Tammy L. Triplett, PA-C  Multiple Vitamin (ONE-A-DAY MENS PO) Take 1 tablet by mouth daily.      Historical Provider, MD  naproxen (NAPROSYN) 500 MG tablet Take 1 tablet (500 mg total) by mouth 2 (two) times daily. Take with food 09/24/14   Tammy L. Triplett, PA-C  oxyCODONE-acetaminophen (PERCOCET/ROXICET) 5-325 MG per tablet Take 1 tablet by mouth every 4 (four) hours as needed. 08/13/14   Tammy L. Triplett, PA-C  oxymetazoline (AFRIN) 0.05 % nasal spray Place 2 sprays into the nose daily as needed for congestion.    Historical Provider, MD  predniSONE (DELTASONE) 20 MG tablet Take 2 tablets (40 mg total) by mouth daily. For 5 days 08/13/14   Tammy L. Triplett, PA-C  tetrahydrozoline-zinc (VISINE-AC) 0.05-0.25 % ophthalmic solution Place 1 drop into both eyes daily as needed (irritation).    Historical Provider, MD     Past Medical History  Diagnosis Date  . Hypertension   . Gout   . Hypercholesteremia    Past Surgical History  Procedure Laterality Date  . None     Family History  Problem Relation Age of Onset  . Hypertension Mother   . Stroke Father   . Stroke Brother   . Osteoarthritis Brother    History  Substance Use Topics  . Smoking status: Former Smoker -- 32 years    Types: Cigars  . Smokeless tobacco:  Never Used  . Alcohol Use: No    Review of Systems  Constitutional: Negative for diaphoresis.  Respiratory: Positive for chest tightness. Negative for shortness of breath.   Gastrointestinal: Negative for nausea and vomiting.  Musculoskeletal: Positive for back pain.  Neurological: Positive for light-headedness.  All other systems reviewed and are negative.     Allergies  Review of patient's allergies indicates no known allergies.  Home Medications   Prior to Admission medications   Medication Sig Start Date End Date Taking? Authorizing Provider  fish oil-omega-3 fatty acids 1000 MG capsule Take 1 g by mouth daily.   Yes Historical Provider,  MD  Ginkgo Biloba 40 MG TABS Take 1 tablet by mouth daily.   Yes Historical Provider, MD  lisinopril (PRINIVIL,ZESTRIL) 20 MG tablet Take 1 tablet (20 mg total) by mouth daily. 06/16/14  Yes Vanetta MuldersScott Zackowski, MD  Multiple Vitamin (ONE-A-DAY MENS PO) Take 1 tablet by mouth daily.     Yes Historical Provider, MD  naproxen (NAPROSYN) 500 MG tablet Take 1 tablet (500 mg total) by mouth 2 (two) times daily. Take with food 09/24/14  Yes Tammy L. Triplett, PA-C  oxymetazoline (AFRIN) 0.05 % nasal spray Place 2 sprays into the nose daily as needed for congestion.   Yes Historical Provider, MD  tetrahydrozoline-zinc (VISINE-AC) 0.05-0.25 % ophthalmic solution Place 1 drop into both eyes daily as needed (irritation).   Yes Historical Provider, MD  amLODipine (NORVASC) 5 MG tablet Take 1 tablet (5 mg total) by mouth daily. 11/09/14   Donnetta HutchingBrian Emina Ribaudo, MD  famotidine (PEPCID) 20 MG tablet Take 1 tablet (20 mg total) by mouth 2 (two) times daily. 11/09/14   Donnetta HutchingBrian Abbegail Matuska, MD  HYDROcodone-acetaminophen (NORCO/VICODIN) 5-325 MG per tablet Take one-two tabs po q 4-6 hrs prn pain Patient not taking: Reported on 11/09/2014 09/24/14   Tammy L. Triplett, PA-C  lisinopril (PRINIVIL,ZESTRIL) 20 MG tablet Take 1 tablet (20 mg total) by mouth daily. Patient not taking: Reported on 11/09/2014 08/13/14   Tammy L. Triplett, PA-C  lisinopril (PRINIVIL,ZESTRIL) 20 MG tablet Take 1 tablet (20 mg total) by mouth daily. Patient not taking: Reported on 11/09/2014 09/24/14   Tammy L. Triplett, PA-C  oxyCODONE-acetaminophen (PERCOCET/ROXICET) 5-325 MG per tablet Take 1 tablet by mouth every 4 (four) hours as needed. Patient not taking: Reported on 11/09/2014 08/13/14   Tammy L. Triplett, PA-C  predniSONE (DELTASONE) 20 MG tablet Take 2 tablets (40 mg total) by mouth daily. For 5 days Patient not taking: Reported on 11/09/2014 08/13/14   Tammy L. Triplett, PA-C   BP 150/123 mmHg  Pulse 61  Temp(Src) 98.4 F (36.9 C)  Resp 17  Ht 6\' 2"  (1.88  m)  Wt 285 lb (129.275 kg)  BMI 36.58 kg/m2  SpO2 98% Physical Exam  Constitutional: He is oriented to person, place, and time. He appears well-developed and well-nourished.  HENT:  Head: Normocephalic and atraumatic.  Eyes: Conjunctivae and EOM are normal. Pupils are equal, round, and reactive to light.  Neck: Normal range of motion. Neck supple.  Cardiovascular: Normal rate, regular rhythm and normal heart sounds.   Pulmonary/Chest: Effort normal and breath sounds normal.  Abdominal: Soft. Bowel sounds are normal.  Musculoskeletal: Normal range of motion.  Neurological: He is alert and oriented to person, place, and time.  Skin: Skin is warm and dry.  Psychiatric: He has a normal mood and affect. His behavior is normal.  Nursing note and vitals reviewed.   ED Course  Procedures (including critical care  time)  DIAGNOSTIC STUDIES: Oxygen Saturation is 98% on room air, normal by my interpretation.    COORDINATION OF CARE: 9:48 AM - Discussed treatment plan with pt at bedside which includes an aspirin, NTG, chest XR, and other screenings, and pt agreed to plan.   Labs Review Labs Reviewed  BASIC METABOLIC PANEL - Abnormal; Notable for the following:    Glucose, Bld 119 (*)    GFR calc non Af Amer 87 (*)    All other components within normal limits  CBC WITH DIFFERENTIAL - Abnormal; Notable for the following:    WBC 12.4 (*)    Neutro Abs 8.6 (*)    Monocytes Absolute 1.2 (*)    All other components within normal limits  TROPONIN I    Imaging Review Dg Chest Port 1 View  11/09/2014   CLINICAL DATA:  Chest pain  EXAM: PORTABLE CHEST - 1 VIEW  COMPARISON:  12/20/2013  FINDINGS: Hypoventilation with decreased lung volume. Mild bibasilar atelectasis. Negative for heart failure or effusion. Negative for pneumonia.  IMPRESSION: Hypoventilation with bibasilar atelectasis   Electronically Signed   By: Marlan Palau M.D.   On: 11/09/2014 10:41     EKG  Interpretation   Date/Time:  Thursday November 09 2014 09:22:20 EST Ventricular Rate:  79 PR Interval:  174 QRS Duration: 98 QT Interval:  371 QTC Calculation: 425 R Axis:   53 Text Interpretation:  Sinus rhythm Probable anteroseptal infarct, old  Nonspecific T abnormalities, lateral leads Confirmed by Alieu Finnigan  MD, Urania Pearlman  [phone]) on 11/09/2014 9:34:28 AM      MDM   Final diagnoses:  Chest pain   Cardiology consult obtained. I recommended admission to the hospital. Patient does not want to be admitted at this time. He understands that this could be a heart related pain with adverse outcomes. He still has chosen the option to leave AMA. Wife was in attendance. Discharge medications Norvasc 5 mg and Pepcid 20 mg. He'll return if worse  I personally performed the services described in this documentation, which was scribed in my presence. The recorded information has been reviewed and is accurate.          Donnetta Hutching, MD 11/09/14 [phone]

## 2014-11-09 NOTE — Discharge Instructions (Signed)
ED follow-up with cardiologist. Phone number given. Also prescription for new blood pressure medication and stomach medication. Return if worse

## 2014-11-09 NOTE — Consult Note (Addendum)
Medical Consultation  Cloyd StagersWilliam T Schone VHQ:[id number] DOB: [birth year] DOA: 11/09/2014  Referring physician: Dr. Adriana Simasook in ED PCP: Colette RibasGOLDING, JOHN CABOT, MD    Impression/recommendations. 1. Chest pain, with some typical and atypical features. Suspect GERD, consider hypertensive urgency off his usual medication. Intermittent for the last 5 days. No previous cardiac history, initial troponin negative, EKG nonacute.  2. Accelerated hypertension. Likely secondary noncompliance with medications, acute pain. Spontaneously improved in the emergency department. 3. Hyperlipidemia. 4. History of gout.   Although GERD may be the etiology, he does have risk factors including hypertension hyperlipidemia and his pain does have some typical features. I have recommended observation, serial troponin, follow-up cardiology recommendations and reevaluation in the morning.  Recommend treatment with H2 blocker empirically for suspected GERD. He should also resume his antihypertensives.  I discussed my recommendations for observation in the hospital, further testing with the patient and his wife at bedside. I discussed that the only way heart disease can be assessed is with further testing as recommended by cardiology.  Patient states he is not willing to stay today/tonight. I discussed thatheart disease could not be ruled out, heart attack cannot be ruled out at this point and that he is placing himself at risk should his symptoms worsen after to the point of severe disability or death. He understands and wishes to be discharged.  I discussed the above with Dr. Adriana Simasook, patient likely to be discharged AMA. Should he change his mind please contact me and I would be happy to observe him overnight.  Brendia Sacksaniel Goodrich, MD [facility] Pager [phone] 11/09/2014, 11:39 AM  Chief Complaint: chest pain  HPI:  48 year old man PMH HTN, hyperlipidemia, gout presented to ED with h/o chest pain. Initial workup was  unremarkable and he was referred for further evaluation.  Reports onset of chest pain 12/5, pressure-like, central part of the chest. Intermittent in nature. No left arm numbness, nausea, vomiting. No shortness of breath. No pleuritic pain. No specific aggravating or alleviating factors except he thought it was GERD and so took Zantac which seemed to help with the symptoms. He does have acid reflux symptoms. He reports today when he woke up he had severe pain in his chest and so came to the emergency department. He now feels confident this is acid reflux. He has no cardiac history. Does have a history of hypertension and has been off his lisinopril for the last several days as he has run out.  In the emergency department afebrile, hypertensive which improved with sublingual nitroglycerin. No hypoxia. Treated with sublingual nitroglycerin. Treated with aspirin by EMS. Basic metabolic panel, troponin unremarkable. WBC slightly elevated 12.4. Chest x-ray independently reviewed, poor quality, low lung volumes. Atelectasis. EKG independently reviewed showed sinus rhythm, anteroseptal infarct, old, no acute changes. No significant change since last study 12/20/2013. He was evaluated by cardiology with recommendations for serial troponin, consider stress test.  Review of Systems:  Negative for fever, visual changes, sore throat, rash, new muscle aches,  SOB, dysuria, bleeding, n/v/abdominal pain.  Past Medical History  Diagnosis Date  . Hypertension   . Gout   . Hypercholesteremia     Past Surgical History  Procedure Laterality Date  . None      Social History:  reports that he has quit smoking. His smoking use included Cigars. He has never used smokeless tobacco. He reports that he does not drink alcohol or use illicit drugs.  No Known Allergies  Family History  Problem Relation Age of Onset  .  Hypertension Mother   . Stroke Father   . Stroke Brother   . Osteoarthritis Brother      Prior  to Admission medications   Medication Sig Start Date End Date Taking? Authorizing Provider  fish oil-omega-3 fatty acids 1000 MG capsule Take 1 g by mouth daily.   Yes Historical Provider, MD  Ginkgo Biloba 40 MG TABS Take 1 tablet by mouth daily.   Yes Historical Provider, MD  lisinopril (PRINIVIL,ZESTRIL) 20 MG tablet Take 1 tablet (20 mg total) by mouth daily. 06/16/14  Yes Vanetta Mulders, MD  Multiple Vitamin (ONE-A-DAY MENS PO) Take 1 tablet by mouth daily.     Yes Historical Provider, MD  naproxen (NAPROSYN) 500 MG tablet Take 1 tablet (500 mg total) by mouth 2 (two) times daily. Take with food 09/24/14  Yes Tammy L. Triplett, PA-C  oxymetazoline (AFRIN) 0.05 % nasal spray Place 2 sprays into the nose daily as needed for congestion.   Yes Historical Provider, MD  tetrahydrozoline-zinc (VISINE-AC) 0.05-0.25 % ophthalmic solution Place 1 drop into both eyes daily as needed (irritation).   Yes Historical Provider, MD  HYDROcodone-acetaminophen (NORCO/VICODIN) 5-325 MG per tablet Take one-two tabs po q 4-6 hrs prn pain Patient not taking: Reported on 11/09/2014 09/24/14   Tammy L. Triplett, PA-C  lisinopril (PRINIVIL,ZESTRIL) 20 MG tablet Take 1 tablet (20 mg total) by mouth daily. Patient not taking: Reported on 11/09/2014 08/13/14   Tammy L. Triplett, PA-C  lisinopril (PRINIVIL,ZESTRIL) 20 MG tablet Take 1 tablet (20 mg total) by mouth daily. Patient not taking: Reported on 11/09/2014 09/24/14   Tammy L. Triplett, PA-C  oxyCODONE-acetaminophen (PERCOCET/ROXICET) 5-325 MG per tablet Take 1 tablet by mouth every 4 (four) hours as needed. Patient not taking: Reported on 11/09/2014 08/13/14   Tammy L. Triplett, PA-C  predniSONE (DELTASONE) 20 MG tablet Take 2 tablets (40 mg total) by mouth daily. For 5 days Patient not taking: Reported on 11/09/2014 08/13/14   Tammy L. Triplett, PA-C   Physical Exam: Filed Vitals:   11/09/14 1014 11/09/14 1019 11/09/14 1030 11/09/14 1100  BP: 136/103 153/94  132/92 132/97  Pulse:   71 72  Temp:      Resp:   14 19  Height:      Weight:      SpO2:   97% 99%   General: Examined in the emergency department. Appears calm and comfortable Eyes: PERRL, normal lids, irises  ENT: grossly normal hearing, lips & tongue Neck: no LAD, masses or thyromegaly Cardiovascular: RRR, no m/r/g. No LE edema. Respiratory: CTA bilaterally, no w/r/r. Normal respiratory effort. Abdomen: soft, ntnd Skin: no rash or induration seen on limited exam Musculoskeletal: grossly normal tone BUE/BLE Psychiatric: grossly normal mood and affect, speech fluent and appropriate Neurologic: grossly non-focal.  Wt Readings from Last 3 Encounters:  11/09/14 129.275 kg (285 lb)  09/24/14 126.1 kg (278 lb)  08/13/14 124.739 kg (275 lb)    Labs on Admission:  Basic Metabolic Panel:  Recent Labs Lab 11/09/14 0930  NA 141  K 3.8  CL 105  CO2 22  GLUCOSE 119*  BUN 20  CREATININE 1.00  CALCIUM 9.1   CBC:  Recent Labs Lab 11/09/14 0930  WBC 12.4*  NEUTROABS 8.6*  HGB 15.4  HCT 44.2  MCV 80.5  PLT 225    Cardiac Enzymes:  Recent Labs Lab 11/09/14 0930  TROPONINI <0.30    Radiological Exams on Admission: Dg Chest Port 1 View  11/09/2014   CLINICAL DATA:  Chest  pain  EXAM: PORTABLE CHEST - 1 VIEW  COMPARISON:  12/20/2013  FINDINGS: Hypoventilation with decreased lung volume. Mild bibasilar atelectasis. Negative for heart failure or effusion. Negative for pneumonia.  IMPRESSION: Hypoventilation with bibasilar atelectasis   Electronically Signed   By: Marlan Palauharles  Clark M.D.   On: 11/09/2014 10:41    Principal Problem:   Chest pain Active Problems:   HTN (hypertension)   Hyperlipidemia   Time spent: 45 minutes  Brendia Sacksaniel Goodrich, MD  [facility] Pager [phone] 11/09/2014, 11:39 AM

## 2014-11-09 NOTE — Consult Note (Signed)
CARDIOLOGY CONSULT NOTE   Patient ID: Cloyd StagersWilliam T Trost MRN: [id number] DOB/AGE: January 28, 1966 48 y.o.  Admit Date: 11/09/2014 Referring Physician: ER-Dr.Cook Primary Physician: Colette RibasGOLDING, JOHN CABOT, MD Consulting Cardiologist: Dina RichBranch, Alaze Garverick MD Primary Cardiologist: New Reason for Consultation: Chest Pain  Clinical Summary Mr. Manson PasseyBrown is a 48 y.o.male with no prior cardiac hx, but history of hypertension, gout, PUD, presented to ER after 2 days of chest pain, substernal pressure, upon awakening each morning. He states that he ran out of antihypertensive medications approximately 3 days ago. He no longer has a primary care physician, and received his antihypertensive medications from her recent ER visit for gout flareup,and was found to be hypertensive at that time. On prior ER visit list. He was found to be on Prinivil 20 mg by mouth daily. He states that the pressure was substernal, worse in the morning, that better later in the day, but constant. He states this morning on awakening. The pressure in his chest was worse, radiating to his back. He had no associated shortness of breath, diaphoresis, dizziness, or nausea. He states however that each time he felt his heart beat, the pain worsened. As result of these symptoms he called EMS. He stated in the field his blood pressure was extremely elevated, although I do not see any documentation.  On arrival to the emergency room he was found to be hypertensive at 161/109. Heart rate 58 bpm, O2 sat 96%, he was afebrile. Review of labs were essentially normal with the exception mildly elevated blood glucose of 119. Mildly elevated white blood cells of 12.4. Initial troponin less than 0.30. Chest x-ray revealed hypoventilation with bibasilar atelectasis. EKG revealed normal sinus rhythm with T-wave flattening laterally and questionable anteroseptal Q waves. He was treated with nitroglycerin 3, with normalization of blood pressure. Discomfort in his chest is  resolved.  Of note, his wife at bedside states that he does snore and quits breathing at night while sleeping. He works rotating nights and days. Takes OTC sleep aid and also drinks a lot of Mt. Dew.   No Known Allergies  Medications Scheduled Medications:    Infusions:    PRN Medications:      Past Medical History  Diagnosis Date  . Hypertension   . Gout   . Gout   . Hypercholesteremia     History reviewed. No pertinent past surgical history.  Family History  Problem Relation Age of Onset  . Hypertension Mother   . Stroke Father   . Stroke Brother   . Osteoarthritis Brother     Social History Mr. Manson PasseyBrown reports that he has quit smoking. His smoking use included Cigars. He has never used smokeless tobacco. Mr. Manson PasseyBrown reports that he does not drink alcohol.  Review of Systems Complete review of systems are found to be negative unless outlined in H&P above.  Physical Examination Blood pressure 132/97, pulse 72, temperature 98.4 F (36.9 C), resp. rate 19, height 6\' 2"  (1.88 m), weight 285 lb (129.275 kg), SpO2 99 %. No intake or output data in the 24 hours ending 11/09/14 1149  Telemetry: Normal sinus rhythm  GEN: No acute distress HEENT: Conjunctiva and lids normal, oropharynx clear with moist mucosa. Neck: Supple, no elevated JVP or carotid bruits, no thyromegaly. Lungs: Clear to auscultation, nonlabored breathing at rest. Cardiac: Regular rate and rhythm, no S3 or significant systolic murmur, no pericardial rub. Abdomen: Soft, nontender, no hepatomegaly, bowel sounds present, no guarding or rebound. Extremities: No pitting edema, distal pulses 2+.  Skin: Warm and dry. Musculoskeletal: No kyphosis. Neuropsychiatric: Alert and oriented x3, affect grossly appropriate.  Prior Cardiac Testing/Procedures None Lab Results  Basic Metabolic Panel:  Recent Labs Lab 11/09/14 0930  NA 141  K 3.8  CL 105  CO2 22  GLUCOSE 119*  BUN 20  CREATININE 1.00   CALCIUM 9.1    CBC:  Recent Labs Lab 11/09/14 0930  WBC 12.4*  NEUTROABS 8.6*  HGB 15.4  HCT 44.2  MCV 80.5  PLT 225    Cardiac Enzymes:  Recent Labs Lab 11/09/14 0930  TROPONINI <0.30    Radiology: Dg Chest Port 1 View  11/09/2014   CLINICAL DATA:  Chest pain  EXAM: PORTABLE CHEST - 1 VIEW  COMPARISON:  12/20/2013  FINDINGS: Hypoventilation with decreased lung volume. Mild bibasilar atelectasis. Negative for heart failure or effusion. Negative for pneumonia.  IMPRESSION: Hypoventilation with bibasilar atelectasis   Electronically Signed   By: Marlan Palauharles  Clark M.D.   On: 11/09/2014 10:41     ECG: NSR with T-wave flattening in the lateral leads.  Impression and Recommendations  1.Chest Pain: Likely related to hypertensive urgency.  Will restart antihypertensive, check echo for LV function. Continue to cycle cardiac enzymes. Initial troponin is normal. Repeat EKG in am. May do better with amlodipine vs ACE with chronic gout. Possible stress test in am. Discuss with Dr. Wyline MoodBranch  2. Hypertensive Urgency: Start amlodipine 5 mg daily and titrate up if BP not well controlled. He has classic symptoms and presentation of sleep apnea. He will need overnight O2 Sat at the very least, but full OSA evaluation is recommended as OP. Stop Mt. Dew.   3.Chronic Gout: Further work up per PTH. Consult nephrology if clinically indicated.   4. Hx of PUD: Recommend H-2 blocker vs PPI. No reported bleeding or melena.   Signed: Bettey MareKathryn M. Lawrence NP [facility]  11/09/2014, 11:49 AM Co-Sign MD  Patient seen and discussed with NP Lyman BishopLawrence, I agree with her documentation above. 48 yo male hx of HTN, PUD admitted with chest pain. Reports he ran out of his bp meds a few days ago, he does not have a pcp. Describes chest pain on and off since Saturday. Described as a pressure in midchest, can occur at rest or with exertion. Has had some nonspecific abdominal pain over the same time period, , but denies  any SOB, palpitaitons, N/V, or diaphoresis. Symptoms can last up to 1 hour, they are not positional. Has had relief with using his friends antacid med, but also NG in the ER.   Trop neg x1, WBC 12.4, Hgb 15.4, Plt 225, K 3.8, Cr 1.00 CXR no acute process EKG SR non-specific ST/T changes  Mixed story for possible cardiac chest pain in patient with CAD risk factors (age, tobacco, HTN). He has not seen a pcp in over 2 years and very well could have HL or DM. Borderline HEART score 3-4, recommend observation overnight with serial EKGs, enzymes, and cardiac monitoring. Patient is not willing to stay in hospital, we discussed potential risks that if this is related to his heart he is at risk for potential heart attack. He elects to be discharged from ER. Would recommend starting norvasc 5mg  daily for his bp, ASA 81mg  daily, prn SL NG, and also would start on zantac 150mg  bid. We will arrange f/u in our clinic early next week to reevaluate.  Dominga FerryJ Naveed Humphres MD

## 2014-11-09 NOTE — ED Notes (Addendum)
Dr Irene LimboGoodrich out of patient room stating patient does not want to be admitted. Dr Adriana Simasook made aware and will be in to speak with patient.

## 2014-11-13 ENCOUNTER — Ambulatory Visit (INDEPENDENT_AMBULATORY_CARE_PROVIDER_SITE_OTHER): Payer: BC Managed Care – PPO | Admitting: Adult Health

## 2014-11-13 ENCOUNTER — Encounter: Payer: Self-pay | Admitting: Adult Health

## 2014-11-13 VITALS — BP 146/98 | HR 79 | Ht 73.0 in | Wt 277.0 lb

## 2014-11-13 DIAGNOSIS — R079 Chest pain, unspecified: Secondary | ICD-10-CM

## 2014-11-13 DIAGNOSIS — K219 Gastro-esophageal reflux disease without esophagitis: Secondary | ICD-10-CM | POA: Insufficient documentation

## 2014-11-13 DIAGNOSIS — I10 Essential (primary) hypertension: Secondary | ICD-10-CM

## 2014-11-13 MED ORDER — PANTOPRAZOLE SODIUM 40 MG PO TBEC: 40.0000 mg | DELAYED_RELEASE_TABLET | Freq: Every day | ORAL | Status: AC

## 2014-11-13 NOTE — Assessment & Plan Note (Signed)
Blood pressure is much better controlled on amlodipine. May consider adding HCTZ 12.5 mg in the future but would like to avoid with hx of recurrent gout attacks. May consider increasing the amlodipine to 10 mg on next visit for optimal control. Echocardiogram will be ordered to evaluate LV fx in the setting of long-standing hypertension. If LV fx is normal will increase dose on next visit if necessary.

## 2014-11-13 NOTE — Assessment & Plan Note (Signed)
I will start him on Protonix 40 mg daily and stop Pepcid. I will check a BMET and H-pylori. He will be seen on one month follow up after echo.

## 2014-11-13 NOTE — Progress Notes (Signed)
    HPI: Cody Harmon is a 48 y/o male patient of Dr. Wyline MoodBranch we are following for ongoing assessment and management of uncontrolled hypertension. He was seen in the ER on consultation for chest pain and found to be hypertensive. He was placed on amlodipine and recommended to stay overnight to rule out MI. He refused admission.   Since starting on amlodipine he has had no further complaints of chest pain, but has had significant GERD symptoms. He was placed on Pepcid 20 mg BID but this has not been helpful to him.    No Known Allergies  Current Outpatient Prescriptions  Medication Sig Dispense Refill  . amLODipine (NORVASC) 5 MG tablet Take 1 tablet (5 mg total) by mouth daily. 30 tablet 1  . fish oil-omega-3 fatty acids 1000 MG capsule Take 1 g by mouth daily.    . Ginkgo Biloba 40 MG TABS Take 1 tablet by mouth daily.    Marland Kitchen. HYDROcodone-acetaminophen (NORCO/VICODIN) 5-325 MG per tablet Take one-two tabs po q 4-6 hrs prn pain 15 tablet 0  . Multiple Vitamin (ONE-A-DAY MENS PO) Take 1 tablet by mouth daily.      . naproxen (NAPROSYN) 500 MG tablet Take 1 tablet (500 mg total) by mouth 2 (two) times daily. Take with food 20 tablet 0  . oxyCODONE-acetaminophen (PERCOCET/ROXICET) 5-325 MG per tablet Take 1 tablet by mouth every 4 (four) hours as needed. 15 tablet 0  . oxymetazoline (AFRIN) 0.05 % nasal spray Place 2 sprays into the nose daily as needed for congestion.    . predniSONE (DELTASONE) 20 MG tablet Take 2 tablets (40 mg total) by mouth daily. For 5 days 10 tablet 0  . tetrahydrozoline-zinc (VISINE-AC) 0.05-0.25 % ophthalmic solution Place 1 drop into both eyes daily as needed (irritation).    . pantoprazole (PROTONIX) 40 MG tablet Take 1 tablet (40 mg total) by mouth daily. 90 tablet 3  . [DISCONTINUED] Cetirizine HCl (ZYRTEC ALLERGY) 10 MG CAPS Take 1 capsule (10 mg total) by mouth daily. 30 capsule 0   No current facility-administered medications for this visit.    Past Medical History    Diagnosis Date  . Hypertension   . Gout   . Hypercholesteremia     Past Surgical History  Procedure Laterality Date  . None      ROS: Complete review of systems performed and found to be negative unless outlined above  PHYSICAL EXAM BP 146/98 mmHg  Pulse 79  Ht 6\' 1"  (1.854 m)  Wt 277 lb (125.646 kg)  BMI 36.55 kg/m2 General: Well developed, well nourished, in no acute distress Head: Eyes PERRLA, No xanthomas.   Normal cephalic and atramatic  Lungs: Clear bilaterally to auscultation and percussion. Heart: HRRR S1 S2, without MRG.  Pulses are 2+ & equal.            No carotid bruit. No JVD.  No abdominal bruits. No femoral bruits. Abdomen: Bowel sounds are positive, abdomen soft and non-tender without masses or                  Hernia's noted. Msk:  Back normal, normal gait. Normal strength and tone for age. Extremities: No clubbing, cyanosis or edema.  DP +1 Neuro: Alert and oriented X 3. Psych:  Good affect, responds appropriately  ASSESSMENT AND PLAN

## 2014-11-13 NOTE — Progress Notes (Deleted)
Name: Cody StagersWilliam T Harmon    DOB: 11/05/1966  Age: 48 y.o.  MR#: [id number]       PCP:  Colette RibasGOLDING, JOHN CABOT, MD      Insurance: Payor: BLUE [employer]UE [employer]IELD / Plan: BCBS  PPO / Product Type: *No Product type* /   CC:    Chief Complaint  Patient presents with  . Atrial Fibrillation    VS Filed Vitals:   11/13/14 1510  BP: 146/98  Pulse: 79  Height: 6\' 1"  (1.854 m)  Weight: 277 lb (125.646 kg)    Weights Current Weight  11/13/14 277 lb (125.646 kg)  11/09/14 285 lb (129.275 kg)  09/24/14 278 lb (126.1 kg)    Blood Pressure  BP Readings from Last 3 Encounters:  11/13/14 146/98  11/09/14 155/102  09/24/14 158/94     Admit date:  (Not on file) Last encounter with RMR:  Visit date not found   Allergy Review of patient's allergies indicates no known allergies.  Current Outpatient Prescriptions  Medication Sig Dispense Refill  . amLODipine (NORVASC) 5 MG tablet Take 1 tablet (5 mg total) by mouth daily. 30 tablet 1  . famotidine (PEPCID) 20 MG tablet Take 1 tablet (20 mg total) by mouth 2 (two) times daily. 30 tablet 0  . fish oil-omega-3 fatty acids 1000 MG capsule Take 1 g by mouth daily.    . Ginkgo Biloba 40 MG TABS Take 1 tablet by mouth daily.    Marland Kitchen. HYDROcodone-acetaminophen (NORCO/VICODIN) 5-325 MG per tablet Take one-two tabs po q 4-6 hrs prn pain 15 tablet 0  . lisinopril (PRINIVIL,ZESTRIL) 20 MG tablet Take 1 tablet (20 mg total) by mouth daily. 30 tablet 1  . Multiple Vitamin (ONE-A-DAY MENS PO) Take 1 tablet by mouth daily.      . naproxen (NAPROSYN) 500 MG tablet Take 1 tablet (500 mg total) by mouth 2 (two) times daily. Take with food 20 tablet 0  . oxyCODONE-acetaminophen (PERCOCET/ROXICET) 5-325 MG per tablet Take 1 tablet by mouth every 4 (four) hours as needed. 15 tablet 0  . oxymetazoline (AFRIN) 0.05 % nasal spray Place 2 sprays into the nose daily as needed for congestion.    . predniSONE (DELTASONE) 20 MG tablet Take 2 tablets (40 mg total) by mouth daily.  For 5 days 10 tablet 0  . tetrahydrozoline-zinc (VISINE-AC) 0.05-0.25 % ophthalmic solution Place 1 drop into both eyes daily as needed (irritation).    . [DISCONTINUED] Cetirizine HCl (ZYRTEC ALLERGY) 10 MG CAPS Take 1 capsule (10 mg total) by mouth daily. 30 capsule 0   No current facility-administered medications for this visit.    Discontinued Meds:    Medications Discontinued During This Encounter  Medication Reason  . lisinopril (PRINIVIL,ZESTRIL) 20 MG tablet Error  . lisinopril (PRINIVIL,ZESTRIL) 20 MG tablet Error    Patient Active Problem List   Diagnosis Date Noted  . Chest pain 11/09/2014  . HTN (hypertension) 11/09/2014  . Hyperlipidemia 11/09/2014    LABS    Component Value Date/Time   NA 141 11/09/2014 0930   NA 141 06/16/2014 1350   NA 142 01/20/2014 0900   K 3.8 11/09/2014 0930   K 4.2 06/16/2014 1350   K 4.3 01/20/2014 0900   CL 105 11/09/2014 0930   CL 106 06/16/2014 1350   CL 106 01/20/2014 0900   CO2 22 11/09/2014 0930   CO2 23 06/16/2014 1350   CO2 25 01/20/2014 0900   GLUCOSE 119* 11/09/2014 0930   GLUCOSE 100*  06/16/2014 1350   GLUCOSE 122* 01/20/2014 0900   BUN 20 11/09/2014 0930   BUN 22 06/16/2014 1350   BUN 15 01/20/2014 0900   CREATININE 1.00 11/09/2014 0930   CREATININE 1.06 06/16/2014 1350   CREATININE 1.08 01/20/2014 0900   CALCIUM 9.1 11/09/2014 0930   CALCIUM 9.2 06/16/2014 1350   CALCIUM 9.4 01/20/2014 0900   GFRNONAA 87* 11/09/2014 0930   GFRNONAA 81* 06/16/2014 1350   GFRNONAA 79* 01/20/2014 0900   GFRAA >90 11/09/2014 0930   GFRAA >90 06/16/2014 1350   GFRAA >90 01/20/2014 0900   CMP     Component Value Date/Time   NA 141 11/09/2014 0930   K 3.8 11/09/2014 0930   CL 105 11/09/2014 0930   CO2 22 11/09/2014 0930   GLUCOSE 119* 11/09/2014 0930   BUN 20 11/09/2014 0930   CREATININE 1.00 11/09/2014 0930   CALCIUM 9.1 11/09/2014 0930   PROT 7.6 01/20/2014 0900   ALBUMIN 4.0 01/20/2014 0900   AST 25 01/20/2014 0900    ALT 31 01/20/2014 0900   ALKPHOS 92 01/20/2014 0900   BILITOT 0.9 01/20/2014 0900   GFRNONAA 87* 11/09/2014 0930   GFRAA >90 11/09/2014 0930       Component Value Date/Time   WBC 12.4* 11/09/2014 0930   WBC 6.2 06/16/2014 1350   WBC 7.7 01/20/2014 0900   HGB 15.4 11/09/2014 0930   HGB 15.8 06/16/2014 1350   HGB 16.4 01/20/2014 0900   HCT 44.2 11/09/2014 0930   HCT 45.5 06/16/2014 1350   HCT 48.4 01/20/2014 0900   MCV 80.5 11/09/2014 0930   MCV 78.7 06/16/2014 1350   MCV 82.2 01/20/2014 0900    Lipid Panel  No results found for: CHOL, TRIG, HDL, CHOLHDL, VLDL, LDLCALC, LDLDIRECT  ABG    Component Value Date/Time   TCO2 23 11/30/2012 0954     No results found for: TSH BNP (last 3 results) No results for input(s): PROBNP in the last 8760 hours. Cardiac Panel (last 3 results) No results for input(s): CKTOTAL, CKMB, TROPONINI, RELINDX in the last 72 hours.  Iron/TIBC/Ferritin/ %Sat No results found for: IRON, TIBC, FERRITIN, IRONPCTSAT   EKG Orders placed or performed during the hospital encounter of 11/09/14  . ED EKG  . ED EKG  . EKG 12-Lead  . EKG 12-Lead  . EKG 12-Lead  . EKG 12-Lead  . EKG     Prior Assessment and Plan Problem List as of 11/13/2014      Cardiovascular and Mediastinum   HTN (hypertension)     Other   Chest pain   Hyperlipidemia       Imaging: Dg Chest Port 1 View  11/09/2014   CLINICAL DATA:  Chest pain  EXAM: PORTABLE CHEST - 1 VIEW  COMPARISON:  12/20/2013  FINDINGS: Hypoventilation with decreased lung volume. Mild bibasilar atelectasis. Negative for heart failure or effusion. Negative for pneumonia.  IMPRESSION: Hypoventilation with bibasilar atelectasis   Electronically Signed   By: Marlan Palauharles  Clark M.D.   On: 11/09/2014 10:41

## 2014-11-13 NOTE — Patient Instructions (Addendum)
Your physician recommends that you schedule a follow-up appointment in: 1 month with Dr. Wyline MoodBranch  Your physician has recommended you make the following change in your medication:   STOP PEPCID  START PROTONIX 40 MG DAILY  Your physician recommends that you return for lab work [id number]  [facility]!!

## 2014-11-13 NOTE — Assessment & Plan Note (Signed)
No further chest pain since BP is controlled. No family history of CAD. Will wait to do stress test unless LV fx is abnormal.

## 2014-11-14 ENCOUNTER — Ambulatory Visit (HOSPITAL_COMMUNITY)
Admission: RE | Admit: 2014-11-14 | Discharge: 2014-11-14 | Disposition: A | Discharge status: Home / Self Care | Payer: BC Managed Care – PPO | Source: Ambulatory Visit | Attending: Adult Health | Admitting: Adult Health

## 2014-11-14 ENCOUNTER — Telehealth: Payer: Self-pay | Admitting: *Deleted

## 2014-11-14 DIAGNOSIS — I10 Essential (primary) hypertension: Secondary | ICD-10-CM | POA: Insufficient documentation

## 2014-11-14 DIAGNOSIS — R079 Chest pain, unspecified: Secondary | ICD-10-CM | POA: Insufficient documentation

## 2014-11-14 DIAGNOSIS — E785 Hyperlipidemia, unspecified: Secondary | ICD-10-CM | POA: Diagnosis not present

## 2014-11-14 DIAGNOSIS — I517 Cardiomegaly: Secondary | ICD-10-CM

## 2014-11-14 LAB — BASIC METABOLIC PANEL
ANION GAP: 11 (ref 5–15)
BUN: 16 mg/dL (ref 6–23)
CHLORIDE: 104 meq/L (ref 96–112)
CO2: 27 meq/L (ref 19–32)
CREATININE: 1.32 mg/dL (ref 0.50–1.35)
Calcium: 9.4 mg/dL (ref 8.4–10.5)
GFR calc Af Amer: 72 mL/min — ABNORMAL LOW (ref >90)
GFR calc non Af Amer: 62 mL/min — ABNORMAL LOW (ref >90)
Glucose, Bld: 104 mg/dL — ABNORMAL HIGH (ref 70–99)
Potassium: 4.5 mEq/L (ref 3.7–5.3)
Sodium: 142 mEq/L (ref 137–147)

## 2014-11-14 MED ORDER — AMLODIPINE BESYLATE 5 MG PO TABS: 5.0000 mg | ORAL_TABLET | Freq: Every day | ORAL | Status: AC

## 2014-11-14 NOTE — Progress Notes (Signed)
  Echocardiogram 2D Echocardiogram has been performed.  Cody Harmon 11/14/2014, 10:59 AM

## 2014-11-14 NOTE — Telephone Encounter (Signed)
Pt made aware of echo and bmet results, verbalized understanding. Will forward to Dr. Phillips OdorGolding. Pt will have a f/u of 6 months. Refilled amlodipine.

## 2014-11-14 NOTE — Telephone Encounter (Signed)
-----   Message from Jodelle GrossKathryn M Lawrence, NP sent at 11/14/2014 11:53 AM EST ----- Echo report reviewed. Mild enlargement of left ventricle but good systolic function. Just need to keep BP under control.

## 2014-12-01 DEATH — deceased

## 2014-12-22 ENCOUNTER — Ambulatory Visit: Payer: BC Managed Care – PPO | Admitting: Cardiology

## 2014-12-29 ENCOUNTER — Encounter: Payer: Self-pay | Admitting: *Deleted
